# Patient Record
Sex: Male | Born: 1950 | Race: White | Hispanic: No | Marital: Married | Smoking: Never smoker
Health system: Southern US, Community
[De-identification: ages and names within clinical notes are randomized; demographics above are authoritative.]

## PROBLEM LIST (undated history)

## (undated) DIAGNOSIS — I2510 Atherosclerotic heart disease of native coronary artery without angina pectoris: Secondary | ICD-10-CM

## (undated) DIAGNOSIS — F32A Depression, unspecified: Secondary | ICD-10-CM

## (undated) DIAGNOSIS — F909 Attention-deficit hyperactivity disorder, unspecified type: Secondary | ICD-10-CM

## (undated) DIAGNOSIS — E785 Hyperlipidemia, unspecified: Secondary | ICD-10-CM

## (undated) DIAGNOSIS — K269 Duodenal ulcer, unspecified as acute or chronic, without hemorrhage or perforation: Secondary | ICD-10-CM

## (undated) DIAGNOSIS — Z972 Presence of dental prosthetic device (complete) (partial): Secondary | ICD-10-CM

## (undated) DIAGNOSIS — I10 Essential (primary) hypertension: Secondary | ICD-10-CM

## (undated) DIAGNOSIS — F329 Major depressive disorder, single episode, unspecified: Secondary | ICD-10-CM

## (undated) DIAGNOSIS — K259 Gastric ulcer, unspecified as acute or chronic, without hemorrhage or perforation: Secondary | ICD-10-CM

## (undated) DIAGNOSIS — H9190 Unspecified hearing loss, unspecified ear: Secondary | ICD-10-CM

## (undated) HISTORY — DX: Atherosclerotic heart disease of native coronary artery without angina pectoris: I25.10

## (undated) HISTORY — DX: Duodenal ulcer, unspecified as acute or chronic, without hemorrhage or perforation: K26.9

## (undated) HISTORY — DX: Hyperlipidemia, unspecified: E78.5

## (undated) HISTORY — PX: CORONARY ANGIOPLASTY: SHX604

## (undated) HISTORY — PX: INTESTINAL BYPASS: SHX1099

---

## 2005-09-02 ENCOUNTER — Emergency Department: Payer: Self-pay | Admitting: Emergency Medicine

## 2007-05-13 ENCOUNTER — Inpatient Hospital Stay (HOSPITAL_COMMUNITY): Admission: RE | Admit: 2007-05-13 | Discharge: 2007-05-15 | Payer: Self-pay | Admitting: Cardiovascular Disease

## 2007-07-19 ENCOUNTER — Ambulatory Visit: Payer: Self-pay | Admitting: Cardiovascular Disease

## 2007-07-19 ENCOUNTER — Emergency Department: Payer: Self-pay | Admitting: Internal Medicine

## 2008-01-15 ENCOUNTER — Encounter: Payer: Self-pay | Admitting: Cardiovascular Disease

## 2008-01-15 ENCOUNTER — Ambulatory Visit (HOSPITAL_COMMUNITY): Admission: RE | Admit: 2008-01-15 | Discharge: 2008-01-15 | Payer: Self-pay | Admitting: Cardiovascular Disease

## 2008-01-30 ENCOUNTER — Encounter: Payer: Self-pay | Admitting: Cardiovascular Disease

## 2008-07-02 ENCOUNTER — Encounter: Payer: Self-pay | Admitting: Cardiovascular Disease

## 2008-09-08 ENCOUNTER — Ambulatory Visit: Payer: Self-pay | Admitting: Specialist

## 2009-05-05 ENCOUNTER — Encounter: Payer: Self-pay | Admitting: Cardiovascular Disease

## 2010-04-19 ENCOUNTER — Emergency Department: Payer: Self-pay | Admitting: Emergency Medicine

## 2010-04-19 ENCOUNTER — Encounter: Payer: Self-pay | Admitting: Cardiovascular Disease

## 2010-04-20 ENCOUNTER — Telehealth: Payer: Self-pay | Admitting: Cardiovascular Disease

## 2010-04-21 ENCOUNTER — Encounter: Payer: Self-pay | Admitting: Cardiovascular Disease

## 2010-04-22 ENCOUNTER — Ambulatory Visit: Payer: Self-pay | Admitting: Cardiovascular Disease

## 2010-04-26 ENCOUNTER — Telehealth: Payer: Self-pay | Admitting: Cardiovascular Disease

## 2010-06-09 ENCOUNTER — Encounter: Payer: Self-pay | Admitting: Cardiovascular Disease

## 2010-09-13 NOTE — Cardiovascular Report (Signed)
Summary: Cardiac Cath Other  Cardiac Cath Other   Imported By: Harlon Flor 05/02/2010 14:42:54  _____________________________________________________________________  External Attachment:    Type:   Image     Comment:   External Document

## 2010-09-13 NOTE — Miscellaneous (Signed)
  Clinical Lists Changes  Medications: Added new medication of CRESTOR 20 MG TABS (ROSUVASTATIN CALCIUM) one tablet at bedtime - Signed Rx of CRESTOR 20 MG TABS (ROSUVASTATIN CALCIUM) one tablet at bedtime;  #30 x 0;  Signed;  Entered by: Bishop Dublin, CMA;  Authorized by: Dossie Arbour MD;  Method used: Electronically to [facility] [address], [place], [place], [place]  [address], Ph: [phone], Fax: [phone]    Prescriptions: CRESTOR 20 MG TABS (ROSUVASTATIN CALCIUM) one tablet at bedtime  #30 x 0   Entered by:   Bishop Dublin, CMA   Authorized by:   Dossie Arbour MD   Signed by:   Bishop Dublin, CMA on 06/09/2010   Method used:   Electronically to        [facility] (retail)       [address]       [place], [place]  [address]       Ph: [phone]       Fax: [phone]   RxID:   [id number]

## 2010-09-13 NOTE — Cardiovascular Report (Signed)
Summary: [facility] Cath Report   [facility] Cath Report   Imported By: Roderic Ovens 05/24/2010 09:12:44  _____________________________________________________________________  External Attachment:    Type:   Image     Comment:   External Document

## 2010-09-13 NOTE — Letter (Signed)
Summary: Cardiac Catheterization Instructions- Main Lab  [facility] [address]   [place], [place] [address]   Phone: [phone]  Fax: [phone]     04/21/2010 MRN: [id number]  Joshua Horn [address] [place], [place]  [address]  Dear Joshua Horn,   You are scheduled for Cardiac Catheterization on September 9 with Dr. Mariah Milling         .  Please arrive at the [facility] of [facility]  at 7:45 a.m. on the day of your procedure.  1. DIET     __x__ Nothing to eat or drink after midnight except your medications with a sip of water.  2. Come to the [place] office on September 8 for instructions.   3. MAKE SURE YOU TAKE YOUR ASPIRIN.  4.__x__ YOU MAY TAKE ALL of your remaining medications with a small amount of water.   5. Plan for one night stay - bring personal belongings (i.e. toothpaste, toothbrush, etc.)  6. Bring a current list of your medications and current insurance cards.  7. Must have a responsible person to drive you home.   8. Someone must be with you for the first 24 hours after you arrive home.  9. Please wear clothes that are easy to get on and off and wear slip-on shoes.  *Special note: Every effort is made to have your procedure done on time.  Occasionally there are emergencies that present themselves at the hospital that may cause delays.  Please be patient if a delay does occur.  If you have any questions after you get home, please call the office at the number listed above.  Benedict Needy, RN

## 2010-09-13 NOTE — Letter (Signed)
Summary: [facility] - Office Note  [facility] - Office Note   Imported By: Marylou Mccoy 06/07/2010 10:12:01  _____________________________________________________________________  External Attachment:    Type:   Image     Comment:   External Document

## 2010-09-13 NOTE — Letter (Signed)
Summary: Medical Record Release  Medical Record Release   Imported By: Harlon Flor 05/02/2010 14:43:30  _____________________________________________________________________  External Attachment:    Type:   Image     Comment:   External Document

## 2010-09-13 NOTE — Progress Notes (Signed)
Summary: MEDICATION QUESTIONS  Phone Note Call from Patient Call back at Home Phone [phone]   Caller: SELF Call For: [facility] Summary of Call: WOULD LIKE A CALL FROM ASHLEY REGARDING THE MEDICATION THAT SHE GAVE HIM YESTERDAY Initial call taken by: Harlon Flor,  April 26, 2010 9:05 AM  Follow-up for Phone Call        pt started taking ranexa last night and has no chest pain would like for [place] to send prescrption to [facility].  Follow-up by: Benedict Needy, RN,  April 26, 2010 9:30 AM    New/Updated Medications: RANEXA 500 MG XR12H-TAB (RANOLAZINE) Take 1 tablet by mouth two times a day Prescriptions: RANEXA 500 MG XR12H-TAB (RANOLAZINE) Take 1 tablet by mouth two times a day  #60 x 4   Entered by:   Benedict Needy, RN   Authorized by:   Dossie Arbour MD   Signed by:   Benedict Needy, RN on 04/26/2010   Method used:   Electronically to        [facility] (retail)       [address]       [place], [place]  [address]       Ph: [phone]       Fax: [phone]   RxID:   [id number]

## 2010-09-13 NOTE — Progress Notes (Signed)
Summary: CATH  Phone Note Call from Patient Call back at Home Phone [phone]   Caller: Denton Lank [facility]) Call For: [facility] Summary of Call: PT WOULD LIKE TO SCHEDULE CATH FOR FRIDAY Initial call taken by: Harlon Flor,  April 20, 2010 9:42 AM  Follow-up for Phone Call        notified patient cardiac cath is scheduled for April 22, 2010 at [facility]. Patient aware of cath. Follow-up by: Bishop Dublin, CMA,  April 20, 2010 12:31 PM

## 2010-09-13 NOTE — Letter (Signed)
Summary: [facility]  [facility]   Imported By: Marylou Mccoy 06/07/2010 10:37:19  _____________________________________________________________________  External Attachment:    Type:   Image     Comment:   External Document

## 2010-09-13 NOTE — Cardiovascular Report (Signed)
Summary: Pre Cath Orders   Pre Cath Orders   Imported By: Roderic Ovens 06/15/2010 11:53:01  _____________________________________________________________________  External Attachment:    Type:   Image     Comment:   External Document

## 2010-09-13 NOTE — Cardiovascular Report (Signed)
Summary: [facility]   Imported By: Roderic Ovens 06/24/2010 10:51:31  _____________________________________________________________________  External Attachment:    Type:   Image     Comment:   External Document

## 2010-11-03 ENCOUNTER — Telehealth: Payer: Self-pay

## 2010-11-03 MED ORDER — RANOLAZINE ER 500 MG PO TB12: 500.0000 mg | ORAL_TABLET | Freq: Two times a day (BID) | ORAL | Status: DC

## 2010-11-03 MED ORDER — ROSUVASTATIN CALCIUM 20 MG PO TABS: 20.0000 mg | ORAL_TABLET | Freq: Every day | ORAL | Status: DC

## 2010-11-03 NOTE — Telephone Encounter (Signed)
Needs a refill for ranexa 500 mg one tablet twice a day and crestor 20 mg one tablet at bedtime to [facility].

## 2010-12-27 NOTE — Cardiovascular Report (Signed)
Joshua, Horn                 ACCOUNT NO.:  [id number]   MEDICAL RECORD NO.:  [id number]          PATIENT TYPE:  OIB   LOCATION:  6524                         FACILITY:  [facility]   PHYSICIAN:  Antonieta Iba, MD   DATE OF BIRTH:  20-Feb-1951   DATE OF PROCEDURE:  DATE OF DISCHARGE:                            CARDIAC CATHETERIZATION   PROCEDURE PERFORMED:  1. Coronary angiogram.  2. Left heart catheterization and aortic root angiogram.   PHYSICIANS:  Julien Nordmann, M.D.  Nicki Guadalajara, M.D.   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  He had a stress test that showed  inferior wall hyperprofusion that was reversible.   PROCEDURE IN DETAIL:  After detailed description of the procedure,  including the risks, benefits, and potential alternatives, informed  consent was obtained.  The patient was premedicated with valium 5 mg and  Benadryl 25 mg and was brought to the cardiac catheterization lab.  The  patient was prepped and draped in the usual sterile fashion.   Local anesthesia was obtained using 1% lidocaine.  The right femoral  artery was cannulated using a modified Seldinger technique and a 6-  [place] Intraducer sheath was inserted.   Selective coronary angiography was then performed using a 6-French  Judkins left #4 catheter to engage the left main coronary artery and a 6-  French Judkins right #4 catheter to engage the right coronary artery.  Arteriography was performed using hand injections of contrast in  multiple projections.  Left ventriculography and aortic root shot was  performed by power injection through a 6-French pigtail  catheter.  Following the procedure, the arterial sheath was closed by holding  pressure and maintaining hemostasis and there were no complications.   FINDINGS:  1. Left main coronary artery:  The left main is a moderate size vessel      that trifurcates into the left anterior descending artery, the left      circumflex artery and the ramus vessel.  The artery has no      significant disease.  2. The left anterior descending:  The left anterior descending is a      moderate sized vessel that gives rise to one large bifurcating      diagonal vessel.  There is no significant disease in the diagonal,      there is 30% mid left anterior descending disease in small      sequential regions.  3. Ramus:  The ramus is a moderate size vessel with no significant      disease.  4. Left circumflex:  The left circumflex is a dominant moderate sized      vessel that gives rise to several moderate sized marginal arteries      as  well as posterior descending artery vessel.  The OM1 has 80% to      90% proximal focal concentric stenosis with TIMI-2 to TIMI-3 flow.      The distal left circumflex is notable for severe disease of the      posterior descending artery which is 90% and diffuse throughout the      small branch.  5. Right coronary artery:  The right coronary artery is a nondominant      vessel that is small and occluded at the ostium.  There are      collaterals from the left to the right from the distal obtuse      marginal, distal circumflex to the right coronary artery.  6. Left ventricle:  There is normal systolic function.  No mitral      regurgitation was appreciated.  No aortic stenosis on pullback.   CONCLUSION:  Severe two-vessel disease with occlusion of the nondominant  right coronary artery as well as 80% to 90% stenosis of the obtuse  marginal #1 branch.  There is severe circumflex disease of the posterior  descending artery.  His severe stenosis of the obtuse marginal will be   intervened upon today.  His severe posterior descending artery disease  will be managed medically.  His positive inferior region on stress test  is most likely secondary to his occluded right coronary artery and small  collaterals providing left to right flow.      Antonieta Iba, MD  Electronically Signed     TJG/MEDQ  D:  05/13/2007  T:  05/14/2007  Job:  [id number]

## 2010-12-27 NOTE — Discharge Summary (Signed)
Joshua Horn, Joshua Horn                 ACCOUNT NO.:  [id number]   MEDICAL RECORD NO.:  [id number]          PATIENT TYPE:  OIB   LOCATION:  6524                         FACILITY:  [facility]   PHYSICIAN:  Abelino Derrick, P.A.   DATE OF BIRTH:  [birth year]   DATE OF ADMISSION:  05/13/2007  DATE OF DISCHARGE:  05/15/2007                               DISCHARGE SUMMARY   DISCHARGE DIAGNOSES:  1. Unstable angina, OM PCI and stenting this admission.  2. Slight CK-MB and troponin bump postprocedure.  3. Treated dyslipidemia.  4. Treated hypertension.  5. Family history of coronary disease.   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  He  had a 70% RCA and total PDA in 2000.  He presented to Dr. Mariah Milling in  [place] with dyspnea on exertion and exertional pressure for a couple  weeks prior to his office visit.  He had a stress test that showed  inferior ischemia.  The patient was set up for diagnostic  catheterization.  This was done May 13, 2007.  This revealed a 30%  LAD, 80-90% OM1, and occluded nondominant RCA with collaterals.  LV  function was normal.  The patient underwent OM1 intervention by Dr.  Tresa Endo.  It was a somewhat difficult procedure.  He ultimately did get a  PROMUS stent placed.  The patient did have a bump in his CK and troponin  postprocedure with a peak CK 144 with 11 MB and troponin 1.  He is kept  another 24 hours for observation.  The patient had no further chest  pain, no arrhythmia.  We feel he can be discharged May 15, 2007.   DISCHARGE MEDICATIONS:  1. Lipitor 40 mg a day.  2. Diovan 80 mg a day.  3. Effexor XR 150 daily.  4. Concerta 54 mg a day.  5. Zetia 10 mg a day.  6. Isosorbide mononitrate 30 mg a day.  7. Coated aspirin daily.  8. Plavix 75 mg a day.  9. Metoprolol 25 mg a day.  10.Nitroglycerin sublingual p.r.n.   LABORATORY DATA:  Sodium 136,  potassium 3.9, BUN 15, creatinine 0.67.  White count 7.3, hemoglobin 11.7, hematocrit 33, platelets 240.  CK  peaked as noted above.   EKG shows sinus rhythm with no acute changes.   DISPOSITION:  The patient is discharged in stable condition and will  follow up with Dr. Mariah Milling in [place] in a couple weeks.      Abelino Derrick, P.ALenard Lance  D:  05/15/2007  T:  05/15/2007  Job:  [id number]

## 2010-12-27 NOTE — Cardiovascular Report (Signed)
Joshua Horn, Joshua Horn                 ACCOUNT NO.:  [id number]   MEDICAL RECORD NO.:  [id number]          PATIENT TYPE:  OIB   LOCATION:  2852                         FACILITY:  [facility]   PHYSICIAN:  Antonieta Iba, MD   DATE OF BIRTH:  [birth year]   DATE OF PROCEDURE:  01/15/2008  DATE OF DISCHARGE:                            CARDIAC CATHETERIZATION   PROCEDURE PERFORMED:  1. Coronary angiogram.  2. Left heart catheterization.  3. Left ventriculogram.   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  He had a 2.5 x 18 mm Promus stent placed in the  OM with good results.  He has been symptom-free for many months until  recently.  Over the past several weeks to months, he has developed  worsening night sweats, profound fatigue.  These symptoms are similar to  what he had prior to his obtuse marginal stenting.  Given his symptoms  and known coronary disease, he was sent for cardiac catheterization for  further evaluation.   PROCEDURE IN DETAIL:  After detailed description of the procedure,  including the risks, benefits, and potential alternatives, informed  consent was obtained.  The patient was premedicated with Valium 5 mg and  Benadryl 25 mg and was brought to the cardiac catheterization lab.  The  patient was prepped and draped in the usual sterile fashion.   Local anesthesia was obtained using 1% lidocaine.  The right femoral  artery was cannulated using a modified Seldinger technique and a 5-  French introducer sheath was inserted.   Selective coronary angiography was then performed using a 5-French  Judkins left #4 catheter to engage the left main coronary artery and a 5-  French Judkins  right #4 catheter to engage the right coronary artery.  Angiography was performed using hand injections of contrast in multiple  projections.  Left ventriculography was performed using a 5-French  pigtail catheter.  Following the procedure, the arterial sheath was  removed and hemostasis was obtained.  No complications were noted.   FINDINGS:  1. Left main coronary artery; the left main is a moderate-sized vessel      that trifurcates into the left anterior descending, the left      circumflex and the ramus vessel.  The artery has no significant      disease.  2. Left anterior descending; the LAD is a moderate-sized vessel that      gives one large bifurcating diagonal vessel.  There is mild mid LAD      disease in the region of myocardial bridging.  There is      approximately 30%-40% diffuse disease.  There is also a region of      40%-50% mid diagonal disease after the first diagonal branch      takeoff.  Otherwise, there is no significant disease noted.  3. Ramus; the ramus is a moderate-sized vessel that has a 30%-40%      proximal diffuse disease.  Otherwise, no significant disease.  4. Left circumflex; the left circumflex is a dominant or codominant      moderate-sized vessel that gives rise to several obtuse marginal      branches.  The OM1 has had previous stent proximally and this      appears to be stable with mild in-stent restenosis.  There is 30%      ostial disease.  Otherwise, there is no significant disease of the      OM1.  The OM2 and 3 are small to moderate-sized vessels.  The      distal OM/PDA is severely diseased and subtotally occluded and is a      small branch.  Otherwise, no significant disease in the OM2 and 3      and circumflex.  5. Right coronary artery; the RCA is a nondominant or codominant      vessel that is small and occluded at the ostial/proximal region.      There are small collaterals from left-to-right extending to the      distal OM.   Left  ventricles/ventriculography; there is normal systolic function.  No  mitral regurgitation appreciated.  No aortic stenosis on pullback.   CONCLUSION:  Mild-to-moderate ramus and LAD disease as noted above.  Patent stent in the proximal OM1.  A 100% proximally occluded RCA.  Severe distal PDA disease of the circumflex.  No significant change  since the prior catheterization in 2008.  Medical management has been  recommended.  These images were reviewed with Dr. Yates Decamp who is in  agreement.  He will followup in clinic with me in 1-week time.      Antonieta Iba, MD  Electronically Signed     TJG/MEDQ  D:  01/15/2008  T:  01/16/2008  Job:  [id number]

## 2010-12-27 NOTE — Cardiovascular Report (Signed)
NAMESHEILA, GERVASI NO.:  [id number]   MEDICAL RECORD NO.:  [id number]          PATIENT TYPE:  OIB   LOCATION:  6524                         FACILITY:  [facility]   PHYSICIAN:  Nicki Guadalajara, M.D.     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  The patient recently  has experienced chest discomfort and a nuclear stress test demonstrated  moderate ischemia in the mid to distal inferior region and apical  region.  He had undergone diagnostic cardiac catheterization today by  Dr. Julien Nordmann and was found to have total occlusion of his RCA with  left-to-right collaterals.  He had 30% narrowings in his mid LAD, there  was 95-99% stenosis in a moderate-sized upward-takeoff obtuse marginal  vessel, and the distal circumflex PDA-like branch was diffusely narrowed  and small-caliber.  The patient is now referred for percutaneous  coronary intervention to his obtuse marginal vessel of his circumflex  system.   PROCEDURE:  The patient's 5-French arterial sheath was in place from his  diagnostic catheterization by Dr. Mariah Milling.  This sheath was upgraded to a  6-French system.  Since the patient had been not been on antiplatelet  therapy, he was given 600 mg oral Plavix, aspirin, and also Integrilin  double bolus and drip was infused, and weight-adjusted heparinization.  ACT was documented to be therapeutic.  A 6-French Voda guide #4 curved  was used for the procedure.  There was significant difficulty in  cannulating the marginal vessel due to the greater than 120-degree to  probably 150-degree angle arising superiorly off the AV groove  circumflex.  Initially a Luge wire, Asahi soft, Asahi  medium wires were  used.  A 2.5-mm balloon dilatation catheter was also used for support  but still unsuccessful.  Ultimately this was able to be cannulated  successfully by using a Choice PT Graphic moderate-support wire.  With  much difficulty, the lesion was able to be crossed.  Initially, a 2.5 x  15-mm Maverick balloon was then advanced to the proximal portion of this  marginal vessel.  Numerous dilatations were made but there continued to  be a significant dog boneat the site of high-grade stenosis.  This  never yielded with the Maverick balloon despite pressures up to 10  atmospheres.  This was then changed to a PowerSail balloon.  This still  was not successful.  Ultimately the balloon was changed to a DURAglide  and ultimately this was successful in popping the highly stenotic  lesion and dilatation was made up to 10 atmospheres x2.  Because of the  vessel tortuosity and angle, it was felt that a Promus stent would be  optimal.  A 2.5 x 18-mm Promus stent was then able to be successfully  inserted with careful juxtaposition of the proximal portion of the stent  at the ostium  and extending distally.  Two dilatations were made up to  2.81 mm.  A 2.75 Quantum balloon was then attempted to be inserted but  at this point the guiding catheter had become very soft and ultimately  pulled back out of the left main and the wire engagement in the OM  vessel was lost.  Multiple attempts were made in an attempt to re-pass  the wire but due to the high level of difficulty initially with the  complexity of the upward-takeoff lesion and the potential risk and, if  successful, the wire ultimately getting under a strut, since the  angiographic result appeared excellent, the decision was made,  therefore, not to continue attempts to ultimately post dilate.  The  patient tolerated the procedure well.  During the procedure, he did  receive several doses of Versed and fentanyl.  He did experience  chest  pressure with balloon inflation, which improved following balloon  deflation and removal.  Scout angiography confirmed an excellent  angiographic result.  The arterial sheath was sutured in place with  plans for sheath removal later today.   ANGIOGRAPHIC DATA:  Please refer to Dr. Windell Hummingbird diagnostic  catheterization report.  At the start of the percutaneous coronary  intervention, the left circumflex was a large-caliber vessel arising  from the left main.  The first marginal vessel arose superiorly in  almost a 150-degree angle arising from the proximal circumflex and  beyond.  The ostial and proximal segment was at least greater than 95%  stenosis.  The vessel in its mid segment was moderately tortuous.  There  was diffuse distal disease in the PDA and PLA vessels as outlined by Dr.  Mariah Milling.  Following difficult but ultimately successful percutaneous  coronary intervention to the circumflex marginal vessel with PTCA and  ultimate stenting with a 2.75 x 18-mm Promus stent dilated up to 2.81  mm, the 95% stenosis was reduced to 0%.  There was brisk TIMI-3 flow.  There was no evidence for dissection.   IMPRESSION:  Difficult but successful percutaneous transluminal coronary  angioplasty/stenting of a 95% obtuse marginal #1 stenosis of the left  circumflex coronary artery with ultimate insertion of a 2.75 x 18-mm  drug-eluting Promus stent, dilated to 2.81 mm, done with double bolus  Integrilin, heparin, 600 mg of oral Plavix, and aspirin.           ______________________________  Nicki Guadalajara, M.D.     TK/MEDQ  D:  05/13/2007  T:  05/14/2007  Job:  [id number]   cc:   Antonieta Iba, MD  Dewaine Oats

## 2011-01-27 ENCOUNTER — Other Ambulatory Visit: Payer: Self-pay | Admitting: Emergency Medicine

## 2011-01-27 MED ORDER — RANOLAZINE ER 500 MG PO TB12: 500.0000 mg | ORAL_TABLET | Freq: Two times a day (BID) | ORAL | Status: DC

## 2011-05-25 LAB — CARDIAC PANEL(CRET KIN+CKTOT+MB+TROPI)
CK, MB: 7.2 — ABNORMAL HIGH
Total CK: 114
Total CK: 136
Total CK: 144
Troponin I: 0.43 — ABNORMAL HIGH
Troponin I: 0.35 — ABNORMAL HIGH
Troponin I: 1

## 2011-05-25 LAB — BASIC METABOLIC PANEL
BUN: 15
CO2: 28
Chloride: 105
Creatinine, Ser: 0.67
Potassium: 3.9

## 2011-05-25 LAB — CBC
MCHC: 34.7
Platelets: 240
RDW: 13.1

## 2011-08-11 ENCOUNTER — Other Ambulatory Visit: Payer: Self-pay

## 2011-08-11 MED ORDER — ROSUVASTATIN CALCIUM 20 MG PO TABS: 20.0000 mg | ORAL_TABLET | Freq: Every day | ORAL | Status: DC

## 2011-11-13 ENCOUNTER — Telehealth: Payer: Self-pay

## 2011-11-13 MED ORDER — ROSUVASTATIN CALCIUM 20 MG PO TABS: 20.0000 mg | ORAL_TABLET | Freq: Every day | ORAL | Status: DC

## 2011-11-13 NOTE — Telephone Encounter (Signed)
Refill sent for crestor 20 mg to [facility] in [place]. Patient is to be called for a follow up appointment per Dr. Mariah Milling.

## 2012-01-01 ENCOUNTER — Other Ambulatory Visit: Payer: Self-pay | Admitting: *Deleted

## 2012-01-01 MED ORDER — ROSUVASTATIN CALCIUM 20 MG PO TABS: 20.0000 mg | ORAL_TABLET | Freq: Every day | ORAL | Status: DC

## 2012-01-01 NOTE — Telephone Encounter (Signed)
Called patient and LMOM to return call to set up appoinment with Dr. Mariah Milling and Refilled Crestor 20 mg with no refills.

## 2012-02-08 ENCOUNTER — Other Ambulatory Visit: Payer: Self-pay

## 2012-02-08 MED ORDER — RANOLAZINE ER 500 MG PO TB12: 500.0000 mg | ORAL_TABLET | Freq: Two times a day (BID) | ORAL | Status: DC

## 2012-02-08 NOTE — Telephone Encounter (Signed)
Refill sent for Ranexa and told pharmacy to have patient call the office to get an appointment with Dr. Mariah Milling.

## 2012-02-09 ENCOUNTER — Ambulatory Visit (INDEPENDENT_AMBULATORY_CARE_PROVIDER_SITE_OTHER): Payer: BC Managed Care – PPO | Admitting: Cardiovascular Disease

## 2012-02-09 ENCOUNTER — Encounter: Payer: Self-pay | Admitting: Cardiovascular Disease

## 2012-02-09 VITALS — BP 148/90 | HR 81 | Ht 72.0 in | Wt 184.0 lb

## 2012-02-09 DIAGNOSIS — I2510 Atherosclerotic heart disease of native coronary artery without angina pectoris: Secondary | ICD-10-CM | POA: Insufficient documentation

## 2012-02-09 DIAGNOSIS — I10 Essential (primary) hypertension: Secondary | ICD-10-CM

## 2012-02-09 DIAGNOSIS — E785 Hyperlipidemia, unspecified: Secondary | ICD-10-CM

## 2012-02-09 MED ORDER — EZETIMIBE 10 MG PO TABS: 10.0000 mg | ORAL_TABLET | Freq: Every day | ORAL | Status: DC

## 2012-02-09 MED ORDER — ROSUVASTATIN CALCIUM 20 MG PO TABS: 20.0000 mg | ORAL_TABLET | Freq: Every day | ORAL | Status: DC

## 2012-02-09 MED ORDER — METOPROLOL SUCCINATE ER 25 MG PO TB24: 25.0000 mg | ORAL_TABLET | Freq: Every day | ORAL | Status: DC

## 2012-02-09 MED ORDER — CLOPIDOGREL BISULFATE 75 MG PO TABS: 75.0000 mg | ORAL_TABLET | Freq: Every day | ORAL | Status: DC

## 2012-02-09 NOTE — Assessment & Plan Note (Signed)
Currently with no symptoms of angina. No further workup at this time. Continue current medication regimen. We have suggested he could wean the ranexa if not needed for chest pain. He is currently asymptomatic.

## 2012-02-09 NOTE — Assessment & Plan Note (Signed)
We have suggested he stay on his current medication regimen. Goal LDL less than 70.

## 2012-02-09 NOTE — Assessment & Plan Note (Signed)
Blood pressure is well controlled on today's visit. No changes made to the medications. 

## 2012-02-09 NOTE — Progress Notes (Signed)
Patient ID: Joshua Horn, male    DOB: 1951/02/19, 61 y.o.   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  He had a 2.5 x 18 mm Promus stent placed in the OM with good results.  He had chest pain symptoms in 2009 with repeat catheterization showing no significant progression of his disease .he presents to establish care in our office.  He reports that he is doing well with no symptoms. He is tolerating his medications well with no complaints. 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 His life is less stressful. Overall he has no complaints. He reports that his cholesterol is within a good range.  EKG shows normal sinus rhythm with rate 81 beats per minute with no significant ST or T wave changes      Outpatient Encounter Prescriptions as of 02/09/2012  Medication Sig Dispense Refill  . clopidogrel (PLAVIX) 75 MG tablet Take 1 tablet (75 mg total) by mouth daily.  30 tablet  11  . ezetimibe (ZETIA) 10 MG tablet Take 1 tablet (10 mg total) by mouth daily.  30 tablet  11  . FLUoxetine (PROZAC) 20 MG tablet Take 20 mg by mouth 2 (two) times daily.      . methylphenidate (CONCERTA) 54 MG CR tablet Take 54 mg by mouth every morning.      . metoprolol succinate (TOPROL-XL) 25 MG 24 hr tablet Take 1 tablet (25 mg total) by mouth daily.  30 tablet  11  . ranolazine (RANEXA) 500 MG 12 hr tablet Take 1 tablet (500 mg total) by mouth 2 (two) times daily.  60 tablet  0  . rosuvastatin (CRESTOR) 20 MG tablet Take 1 tablet (20 mg total) by mouth at bedtime.  30 tablet  11    Review of Systems  Constitutional: Negative.   HENT: Negative.   Eyes: Negative.   Respiratory: Negative.     Cardiovascular: Negative.   Gastrointestinal: Negative.   Musculoskeletal: Negative.   Skin: Negative.   Neurological: Negative.   Hematological: Negative.   Psychiatric/Behavioral: Negative.   All other systems reviewed and are negative.   BP 148/90  Pulse 81  Ht 6' (1.829 m)  Wt 184 lb (83.462 kg)  BMI 24.95 kg/m2  Physical Exam  Nursing note and vitals reviewed. Constitutional: He is oriented to person, place, and time. He appears well-developed and well-nourished.  HENT:  Head: Normocephalic.  Nose: Nose normal.  Mouth/Throat: Oropharynx is clear and moist.  Eyes: Conjunctivae are normal. Pupils are equal, round, and reactive to light.  Neck: Normal range of motion. Neck supple. No JVD present.  Cardiovascular: Normal rate, regular rhythm, S1 normal, S2 normal, normal heart sounds and intact distal pulses.  Exam reveals no gallop and no friction rub.   No murmur heard. Pulmonary/Chest: Effort normal and breath sounds normal. No respiratory distress. He has no wheezes. He has no rales. He exhibits no tenderness.  Abdominal: Soft. Bowel sounds are normal. He exhibits no distension. There is no tenderness.  Musculoskeletal: Normal range of motion. He exhibits no edema and no tenderness.  Lymphadenopathy:    He has no cervical adenopathy.  Neurological: He is  alert and oriented to person, place, and time. Coordination normal.  Skin: Skin is warm and dry. No rash noted. No erythema.  Psychiatric: He has a normal mood and affect. His behavior is normal. Judgment and thought content normal.           Assessment and Plan

## 2012-02-09 NOTE — Patient Instructions (Addendum)
You are doing well. No medication changes were made.  Please call us if you have new issues that need to be addressed before your next appt.  Your physician wants you to follow-up in: 12 months.  You will receive a reminder letter in the mail two months in advance. If you don't receive a letter, please call our office to schedule the follow-up appointment. 

## 2012-10-25 ENCOUNTER — Other Ambulatory Visit: Payer: Self-pay | Admitting: *Deleted

## 2012-10-25 MED ORDER — RANOLAZINE ER 500 MG PO TB12: 500.0000 mg | ORAL_TABLET | Freq: Two times a day (BID) | ORAL | Status: DC

## 2012-10-25 NOTE — Telephone Encounter (Signed)
Refilled Ranexa 500 mg sent to [facility].

## 2012-11-22 ENCOUNTER — Encounter: Payer: Self-pay | Admitting: Cardiovascular Disease

## 2012-11-22 ENCOUNTER — Ambulatory Visit: Payer: Self-pay | Admitting: Cardiovascular Disease

## 2012-11-22 ENCOUNTER — Ambulatory Visit (INDEPENDENT_AMBULATORY_CARE_PROVIDER_SITE_OTHER): Payer: BC Managed Care – PPO | Admitting: Cardiovascular Disease

## 2012-11-22 ENCOUNTER — Other Ambulatory Visit: Payer: Self-pay

## 2012-11-22 VITALS — BP 124/78 | HR 70 | Ht 72.0 in | Wt 190.5 lb

## 2012-11-22 DIAGNOSIS — R079 Chest pain, unspecified: Secondary | ICD-10-CM

## 2012-11-22 DIAGNOSIS — E785 Hyperlipidemia, unspecified: Secondary | ICD-10-CM

## 2012-11-22 DIAGNOSIS — I10 Essential (primary) hypertension: Secondary | ICD-10-CM

## 2012-11-22 DIAGNOSIS — I2510 Atherosclerotic heart disease of native coronary artery without angina pectoris: Secondary | ICD-10-CM

## 2012-11-22 DIAGNOSIS — R0602 Shortness of breath: Secondary | ICD-10-CM

## 2012-11-22 MED ORDER — NITROGLYCERIN 0.4 MG SL SUBL: 0.4000 mg | SUBLINGUAL_TABLET | SUBLINGUAL | Status: DC | PRN

## 2012-11-22 NOTE — Patient Instructions (Addendum)
You are doing well. No medication changes were made.  We will schedule you for a cardiac cath on Friday April 18th   Please call us if you have new issues that need to be addressed before your next appt.  Your physician wants you to follow-up in: 6 months.  You will receive a reminder letter in the mail two months in advance. If you don't receive a letter, please call our office to schedule the follow-up appointment.   [facility] Cardiac Cath Instructions   You are scheduled for a Cardiac Cath on:_4/18/14________________________  Please arrive at __7:00_____am on the day of your procedure  You will need to pre-register prior to the day of your procedure.  Enter through the [employer] at [facility].  Registration is the first desk on your right.  Please take the procedure order we have given you in order to be registered appropriately  Do not eat/drink anything after midnight  Someone will need to drive you home  It is recommended someone be with you for the first 24 hours after your procedure  Wear clothes that are easy to get on/off and wear slip on shoes if possible   Medications bring a current list of all medications with you  _x__ You may take all of your medications the morning of your procedure with enough water to swallow safely   Day of your procedure: Arrive at the [facility] entrance.  Free valet service is available.  After entering the [facility] you will pass on the right admitting/registration desk, proceed pass the Cardiopulmonary desk to the first open hallway on your right, overhead you will see a sign reading Cardiac Cath./Special Procedures Waiting Room.  After taking the hallway, the waiting room is on the left, once you enter the waiting room, there is a wall phone on the left, just inside the door.  Dial [phone] this will connect you to the Special Recovery Nurse's Station; let them know you have arrived.  The usual length of stay after your procedure is about 2 to  3 hours.  This can vary.  If you have any questions, please call our office at [phone], or you may call the cardiac cath lab at [facility] directly at [phone]

## 2012-11-22 NOTE — Assessment & Plan Note (Signed)
Known CAD, occluded RCA, stent to his left circumflex in the past. Now with worsening anginal symptoms.

## 2012-11-22 NOTE — Progress Notes (Signed)
Patient ID: Joshua Horn, male    DOB: 01-Jul-1951, 62 y.o.   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  He had a 2.5 x 18 mm Promus stent placed in the OM with good results.  He had chest pain symptoms in 2009 with repeat catheterization showing no significant progression of his disease .he presents for routine followup and for symptoms of chest pain.  He reports that over the past several weeks, he has had worsening symptoms of shortness of breath, chest pain and sweating. At least 3 severe episodes, one [place]. He had to stop to recover. His wife noticed. He does not exercise on a regular basis. Wife agrees that there has been a change in his ability to exert himself and he is sometimes cold, sweaty. He has felt the need for nitroglycerin for chest pain but did not have any. He was trying not to use it as it gives him a headache. Instead he has been limiting his activities. 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   EKG shows normal sinus rhythm with rate 81 beats per minute with nonspecific ST abnormality in lead V5, V6      Outpatient Encounter Prescriptions as of 11/22/2012  Medication Sig Dispense Refill  . clopidogrel (PLAVIX) 75 MG tablet Take 1 tablet (75 mg total) by mouth daily.  30 tablet  11  . ezetimibe (ZETIA) 10 MG tablet Take 1 tablet (10 mg total) by mouth daily.  30 tablet  11  . FLUoxetine (PROZAC) 20 MG tablet Take 20 mg by mouth 2 (two) times daily.      . methylphenidate (CONCERTA) 54 MG CR tablet Take 54 mg by mouth every morning.      . metoprolol succinate (TOPROL-XL) 25 MG 24 hr tablet Take 1 tablet (25 mg total) by mouth daily.  30 tablet  11  . ranolazine  (RANEXA) 500 MG 12 hr tablet Take 1 tablet (500 mg total) by mouth 2 (two) times daily.  60 tablet  3  . rosuvastatin (CRESTOR) 20 MG tablet Take 1 tablet (20 mg total) by mouth at bedtime.  30 tablet  11  . nitroGLYCERIN (NITROSTAT) 0.4 MG SL tablet Place 1 tablet (0.4 mg total) under the tongue every 5 (five) minutes as needed for chest pain.  25 tablet  6   No facility-administered encounter medications on file as of 11/22/2012.    Review of Systems  Constitutional: Negative.   HENT: Negative.   Eyes: Negative.   Respiratory: Positive for chest tightness.   Cardiovascular: Positive for chest pain.  Gastrointestinal: Negative.   Musculoskeletal: Negative.   Skin: Negative.   Neurological: Negative.   Psychiatric/Behavioral: Negative.   All other systems reviewed and are negative.   BP 124/78  Pulse 70  Ht 6' (1.829 m)  Wt 190 lb 8 oz (86.41 kg)  BMI 25.83 kg/m2  Physical Exam  Nursing note and vitals reviewed. Constitutional: He is oriented to person, place, and time. He appears well-developed and well-nourished.  HENT:  Head: Normocephalic.  Nose: Nose normal.  Mouth/Throat: Oropharynx is clear and moist.  Eyes: Conjunctivae are normal. Pupils are equal, round, and reactive to light.  Neck: Normal range of motion. Neck supple. No JVD present.  Cardiovascular: Normal rate, regular rhythm, S1 normal, S2 normal, normal heart sounds and intact distal pulses.  Exam reveals no gallop and no friction rub.   No murmur heard. Pulmonary/Chest: Effort normal and breath sounds normal. No respiratory distress. He has no wheezes. He has no rales. He exhibits no tenderness.  Abdominal: Soft. Bowel sounds are normal. He exhibits no distension. There is no tenderness.  Musculoskeletal: Normal range of motion. He exhibits no edema and no tenderness.  Lymphadenopathy:    He has no cervical adenopathy.  Neurological: He is alert and oriented to person, place, and time. Coordination normal.   Skin: Skin is warm and dry. No rash noted. No erythema.  Psychiatric: He has a normal mood and affect. His behavior is normal. Judgment and thought content normal.      Assessment and Plan

## 2012-11-22 NOTE — Assessment & Plan Note (Signed)
Blood pressure is well controlled on today's visit. No changes made to the medications. 

## 2012-11-22 NOTE — Assessment & Plan Note (Addendum)
Recent symptoms of chest tightness, sweating, shortness of breath. This is concerning for angina given his history of known coronary artery disease. Wife and patient are very concerned given the dramatic change in his symptoms, also in light of known occluded RCA, prior PCI to his left circumflex. We'll schedule him for a cardiac catheterization next week.  Risk and benefits were discussed with him. No medications to hold. He is agreeable to proceed with the catheterization. We will need to order routine chest x-ray and lab work today in preparation.

## 2012-11-22 NOTE — Assessment & Plan Note (Signed)
Cholesterol is at goal on the current lipid regimen. No changes to the medications were made.  

## 2012-11-23 LAB — CBC WITH DIFFERENTIAL
Eos: 2 % (ref 0–5)
Immature Grans (Abs): 0 10*3/uL (ref 0.0–0.1)
Immature Granulocytes: 0 % (ref 0–2)
Lymphocytes Absolute: 1.6 10*3/uL (ref 0.7–3.1)
MCV: 90 fL (ref 79–97)
Monocytes: 12 % (ref 4–12)
Platelets: 311 10*3/uL (ref 155–379)
RBC: 4.68 x10E6/uL (ref 4.14–5.80)
WBC: 7.6 10*3/uL (ref 3.4–10.8)

## 2012-11-23 LAB — BASIC METABOLIC PANEL
BUN: 14 mg/dL (ref 8–27)
Chloride: 103 mmol/L (ref 97–108)
Creatinine, Ser: 0.87 mg/dL (ref 0.76–1.27)
GFR calc Af Amer: 108 mL/min/{1.73_m2} (ref >59)
GFR calc non Af Amer: 93 mL/min/{1.73_m2} (ref >59)
Glucose: 101 mg/dL — ABNORMAL HIGH (ref 65–99)

## 2012-11-23 LAB — PROTIME-INR: Prothrombin Time: 11.1 s (ref 9.1–12.0)

## 2012-11-29 ENCOUNTER — Ambulatory Visit: Payer: Self-pay | Admitting: Cardiovascular Disease

## 2012-11-29 DIAGNOSIS — I2510 Atherosclerotic heart disease of native coronary artery without angina pectoris: Secondary | ICD-10-CM

## 2012-11-29 HISTORY — PX: CARDIAC CATHETERIZATION: SHX172

## 2013-01-10 ENCOUNTER — Encounter: Payer: Self-pay | Admitting: Cardiovascular Disease

## 2013-01-10 ENCOUNTER — Ambulatory Visit (INDEPENDENT_AMBULATORY_CARE_PROVIDER_SITE_OTHER): Payer: BC Managed Care – PPO | Admitting: Cardiovascular Disease

## 2013-01-10 VITALS — BP 110/82 | HR 66 | Ht 72.0 in | Wt 185.2 lb

## 2013-01-10 DIAGNOSIS — E785 Hyperlipidemia, unspecified: Secondary | ICD-10-CM

## 2013-01-10 DIAGNOSIS — I10 Essential (primary) hypertension: Secondary | ICD-10-CM

## 2013-01-10 DIAGNOSIS — I2510 Atherosclerotic heart disease of native coronary artery without angina pectoris: Secondary | ICD-10-CM

## 2013-01-10 NOTE — Assessment & Plan Note (Signed)
Blood pressure is well controlled on today's visit. No changes made to the medications. 

## 2013-01-10 NOTE — Patient Instructions (Addendum)
You are doing well. No medication changes were made.  Please call us if you have new issues that need to be addressed before your next appt.  Your physician wants you to follow-up in: 12 months.  You will receive a reminder letter in the mail two months in advance. If you don't receive a letter, please call our office to schedule the follow-up appointment. 

## 2013-01-10 NOTE — Progress Notes (Signed)
Patient ID: Joshua Horn, male    DOB: 16-Dec-1950, 62 y.o.   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  He had a 2.5 x 18 mm Promus stent placed in the OM with good results.  He had chest pain symptoms in 2009 with repeat catheterization showing no significant progression of his disease . He presents for routine followup after recent cardiac catheterization.  On his last clinic visit, he was having chest discomfort. Cardiac catheterization done after long discussion Study is dated 11/29/2012. The showed chronically occluded proximal RCA, moderate distal left circumflex disease, OM1 stent was patent with no in-stent restenosis, LAD with mild to moderate mid disease, long region, normal ejection fraction estimated at greater than 55%  Overall he feels well. Now notes that everything is stable. Is trying to bike more. No complications after the cardiac catheterization. No other complaints. Tolerating his medications well  EKG shows normal sinus rhythm with rate 66 beats per minute with no significant ST or T wave changes      Outpatient Encounter Prescriptions as of 01/10/2013  Medication Sig Dispense Refill  . clopidogrel (PLAVIX) 75 MG tablet Take 1 tablet (75 mg total) by mouth daily.  30 tablet  11  . ezetimibe (ZETIA) 10 MG tablet Take 1 tablet (10 mg total) by mouth daily.  30 tablet  11  . FLUoxetine (PROZAC) 20 MG tablet Take 20 mg by mouth 2 (two) times daily.      . methylphenidate (CONCERTA) 54 MG CR tablet Take 54 mg by mouth every morning.      . metoprolol succinate (TOPROL-XL) 25 MG 24 hr tablet Take 1 tablet (25 mg total) by mouth daily.  30 tablet  11  . nitroGLYCERIN (NITROSTAT) 0.4 MG SL tablet Place 1 tablet  (0.4 mg total) under the tongue every 5 (five) minutes as needed for chest pain.  25 tablet  6  . ranolazine (RANEXA) 500 MG 12 hr tablet Take 1 tablet (500 mg total) by mouth 2 (two) times daily.  60 tablet  3  . rosuvastatin (CRESTOR) 20 MG tablet Take 1 tablet (20 mg total) by mouth at bedtime.  30 tablet  11   No facility-administered encounter medications on file as of 01/10/2013.    Review of Systems  Constitutional: Negative.   HENT: Negative.   Eyes: Negative.   Gastrointestinal: Negative.   Musculoskeletal: Negative.   Skin: Negative.   Neurological: Negative.   Psychiatric/Behavioral: Negative.   All other systems reviewed and are negative.   BP 110/82  Pulse 66  Ht 6' (1.829 m)  Wt 185 lb 4 oz (84.029 kg)  BMI 25.12 kg/m2  Physical Exam  Nursing note and vitals reviewed. Constitutional: He is oriented to person, place, and time. He appears well-developed and well-nourished.  HENT:  Head: Normocephalic.  Nose: Nose normal.  Mouth/Throat: Oropharynx is clear and moist.  Eyes: Conjunctivae are normal. Pupils are equal, round, and reactive to light.  Neck: Normal range of motion. Neck supple. No JVD present.  Cardiovascular: Normal rate, regular rhythm, S1 normal, S2 normal, normal heart sounds and intact distal pulses.  Exam reveals no gallop and no friction rub.   No murmur heard. Pulmonary/Chest: Effort normal and  breath sounds normal. No respiratory distress. He has no wheezes. He has no rales. He exhibits no tenderness.  Abdominal: Soft. Bowel sounds are normal. He exhibits no distension. There is no tenderness.  Musculoskeletal: Normal range of motion. He exhibits no edema and no tenderness.  Lymphadenopathy:    He has no cervical adenopathy.  Neurological: He is alert and oriented to person, place, and time. Coordination normal.  Skin: Skin is warm and dry. No rash noted. No erythema.  Psychiatric: He has a normal mood and affect. His behavior is normal.  Judgment and thought content normal.      Assessment and Plan

## 2013-01-10 NOTE — Assessment & Plan Note (Signed)
Stable disease after recent cardiac catheterization. Continue current medications

## 2013-01-10 NOTE — Assessment & Plan Note (Signed)
Cholesterol is at goal on the current lipid regimen. No changes to the medications were made.  

## 2013-02-19 ENCOUNTER — Other Ambulatory Visit: Payer: Self-pay | Admitting: *Deleted

## 2013-02-19 ENCOUNTER — Telehealth: Payer: Self-pay

## 2013-02-19 MED ORDER — EZETIMIBE 10 MG PO TABS: 10.0000 mg | ORAL_TABLET | Freq: Every day | ORAL | Status: DC

## 2013-02-19 MED ORDER — CLOPIDOGREL BISULFATE 75 MG PO TABS: 75.0000 mg | ORAL_TABLET | Freq: Every day | ORAL | Status: DC

## 2013-02-19 MED ORDER — METOPROLOL SUCCINATE ER 25 MG PO TB24: 25.0000 mg | ORAL_TABLET | Freq: Every day | ORAL | Status: DC

## 2013-02-19 NOTE — Telephone Encounter (Signed)
Brooke from Pharmacy calling to inform us pt is taking Prozac and plavix. Says the Prozac tends to inhibit the reabsorption of plavix and wanted Dr. Mariah Milling to be aware

## 2013-02-19 NOTE — Telephone Encounter (Signed)
Refilled Zetia, Metoprolol and Clopidogrel sent to [facility].

## 2013-02-20 NOTE — Telephone Encounter (Signed)
Okay to continue both medications. No recent stent placement

## 2013-02-20 NOTE — Telephone Encounter (Signed)
Pharmacist made aware Understanding verb

## 2013-09-11 ENCOUNTER — Ambulatory Visit: Payer: Self-pay | Admitting: Ophthalmology

## 2013-09-11 DIAGNOSIS — I10 Essential (primary) hypertension: Secondary | ICD-10-CM

## 2013-09-15 ENCOUNTER — Ambulatory Visit: Payer: Self-pay | Admitting: Ophthalmology

## 2014-03-02 ENCOUNTER — Other Ambulatory Visit: Payer: Self-pay | Admitting: *Deleted

## 2014-03-02 MED ORDER — CLOPIDOGREL BISULFATE 75 MG PO TABS: 75.0000 mg | ORAL_TABLET | Freq: Every day | ORAL | Status: DC

## 2014-03-02 MED ORDER — METOPROLOL SUCCINATE ER 25 MG PO TB24: 25.0000 mg | ORAL_TABLET | Freq: Every day | ORAL | Status: DC

## 2014-03-02 MED ORDER — EZETIMIBE 10 MG PO TABS: 10.0000 mg | ORAL_TABLET | Freq: Every day | ORAL | Status: DC

## 2014-03-02 NOTE — Telephone Encounter (Signed)
Requested Prescriptions   Signed Prescriptions Disp Refills  . clopidogrel (PLAVIX) 75 MG tablet 30 tablet 1    Sig: Take 1 tablet (75 mg total) by mouth daily.    Authorizing Provider: Antonieta IbaGOLLAN, TIMOTHY J    Ordering User: Shawnie DapperLOPEZ, MARINA C  . ezetimibe (ZETIA) 10 MG tablet 30 tablet 1    Sig: Take 1 tablet (10 mg total) by mouth daily.    Authorizing Provider: Antonieta IbaGOLLAN, TIMOTHY J    Ordering User: Shawnie DapperLOPEZ, MARINA C  . metoprolol succinate (TOPROL-XL) 25 MG 24 hr tablet 30 tablet 1    Sig: Take 1 tablet (25 mg total) by mouth daily.    Authorizing Provider: Antonieta IbaGOLLAN, TIMOTHY J    Ordering User: Kendrick FriesLOPEZ, MARINA C

## 2014-06-15 ENCOUNTER — Ambulatory Visit: Payer: Self-pay | Admitting: Internal Medicine

## 2014-06-19 ENCOUNTER — Other Ambulatory Visit: Payer: Self-pay | Admitting: *Deleted

## 2014-06-19 MED ORDER — CLOPIDOGREL BISULFATE 75 MG PO TABS: 75.0000 mg | ORAL_TABLET | Freq: Every day | ORAL | Status: DC

## 2014-06-19 MED ORDER — METOPROLOL SUCCINATE ER 25 MG PO TB24: 25.0000 mg | ORAL_TABLET | Freq: Every day | ORAL | Status: DC

## 2014-06-19 MED ORDER — EZETIMIBE 10 MG PO TABS: 10.0000 mg | ORAL_TABLET | Freq: Every day | ORAL | Status: DC

## 2014-12-05 NOTE — Op Note (Signed)
PATIENT NAME:  Joshua RobertsSLOTT, Haley D MR#:  [id number] DATE OF BIRTH:  [birth year]  DATE OF PROCEDURE:  09/15/2013  PREOPERATIVE DIAGNOSIS:  Cataract, left eye.    POSTOPERATIVE DIAGNOSIS:  Cataract, left eye.  PROCEDURE PERFORMED:  Extracapsular cataract extraction using phacoemulsification with placement of an Alcon [id number], 20.5-diopter posterior chamber lens, serial number [id number].  SURGEON:  Maylon PeppersSteven A. Sahil Milner, MD  ASSISTANT:  None.  ANESTHESIA:  4% lidocaine and 0.75% Marcaine in a 50/50 mixture with 10 units/mL of Hylenex added, given as a peribulbar.  ANESTHESIOLOGIST:  Naomie DeanWilliam K. Kephart, MD  COMPLICATIONS:  None.  ESTIMATED BLOOD LOSS:  Less than 1 ml.  DESCRIPTION OF PROCEDURE:  The patient was brought to the operating room and given a peribulbar block.  The patient was then prepped and draped in the usual fashion.  The vertical rectus muscles were imbricated using 5-0 silk sutures.  These sutures were then clamped to the sterile drapes as bridle sutures.  A limbal peritomy was performed extending two clock hours and hemostasis was obtained with cautery.  A partial thickness scleral groove was made at the surgical limbus and dissected anteriorly in a lamellar dissection using an Alcon crescent knife.  The anterior chamber was entered supero-temporally with a Superblade and through the lamellar dissection with a 2.6 mm keratome.  DisCoVisc was used to replace the aqueous and a continuous tear capsulorrhexis was carried out.  Hydrodissection and hydrodelineation were carried out with balanced salt and a 27 gauge canula.  The nucleus was rotated to confirm the effectiveness of the hydrodissection.  Phacoemulsification was carried out using a divide-and-conquer technique.  Total ultrasound time was 1 minute and 27 seconds with an average power of 20.6 percent.  CDE 31.35.    Irrigation/aspiration was used to remove the residual cortex.  DisCoVisc was used to inflate the capsule and the  internal incision was enlarged to 3 mm with the crescent knife.  The intraocular lens was folded and inserted into the capsular bag using the AcrySert delivery system.  Irrigation/aspiration was used to remove the residual DisCoVisc.  Miostat was injected into the anterior chamber through the paracentesis track to inflate the anterior chamber and induce miosis.  Cefuroxime 0.1 mL containing 1 mg of drug was injected via the paracentesis track.  The wound was checked for leaks and none were found. No suture was added.  The conjunctiva was closed with cautery and the bridle sutures were removed.  Two drops of 0.3% Vigamox were placed on the eye.   An eye shield was placed on the eye.  The patient was discharged to the recovery room in good condition.      ____________________________ Maylon PeppersSteven A. Patsy Zaragoza, MD sad:cs D: 09/15/2013 12:38:41 ET T: 09/15/2013 14:26:36 ET JOB#: [id number]  cc: Viviann SpareSteven A. Kamdon Reisig, MD, <Dictator> Erline LevineSTEVEN A Elnoria Livingston MD ELECTRONICALLY SIGNED 09/22/2013 10:14

## 2014-12-18 IMAGING — CR DG CHEST 2V
1 series · 2 of 2 positions shown · non-contrast
Comparison: none

REASON FOR EXAM: SOB, Chest Pain
COMMENTS:

PROCEDURE:     DXR - DXR CHEST PA (OR AP) AND LATERAL  - November 22, 2012  [DATE]
RESULT:     Comparison: None

[Series 2: w chest lat · 0.14mm/px · 2 of 2 slices shown]
[im 1/2]
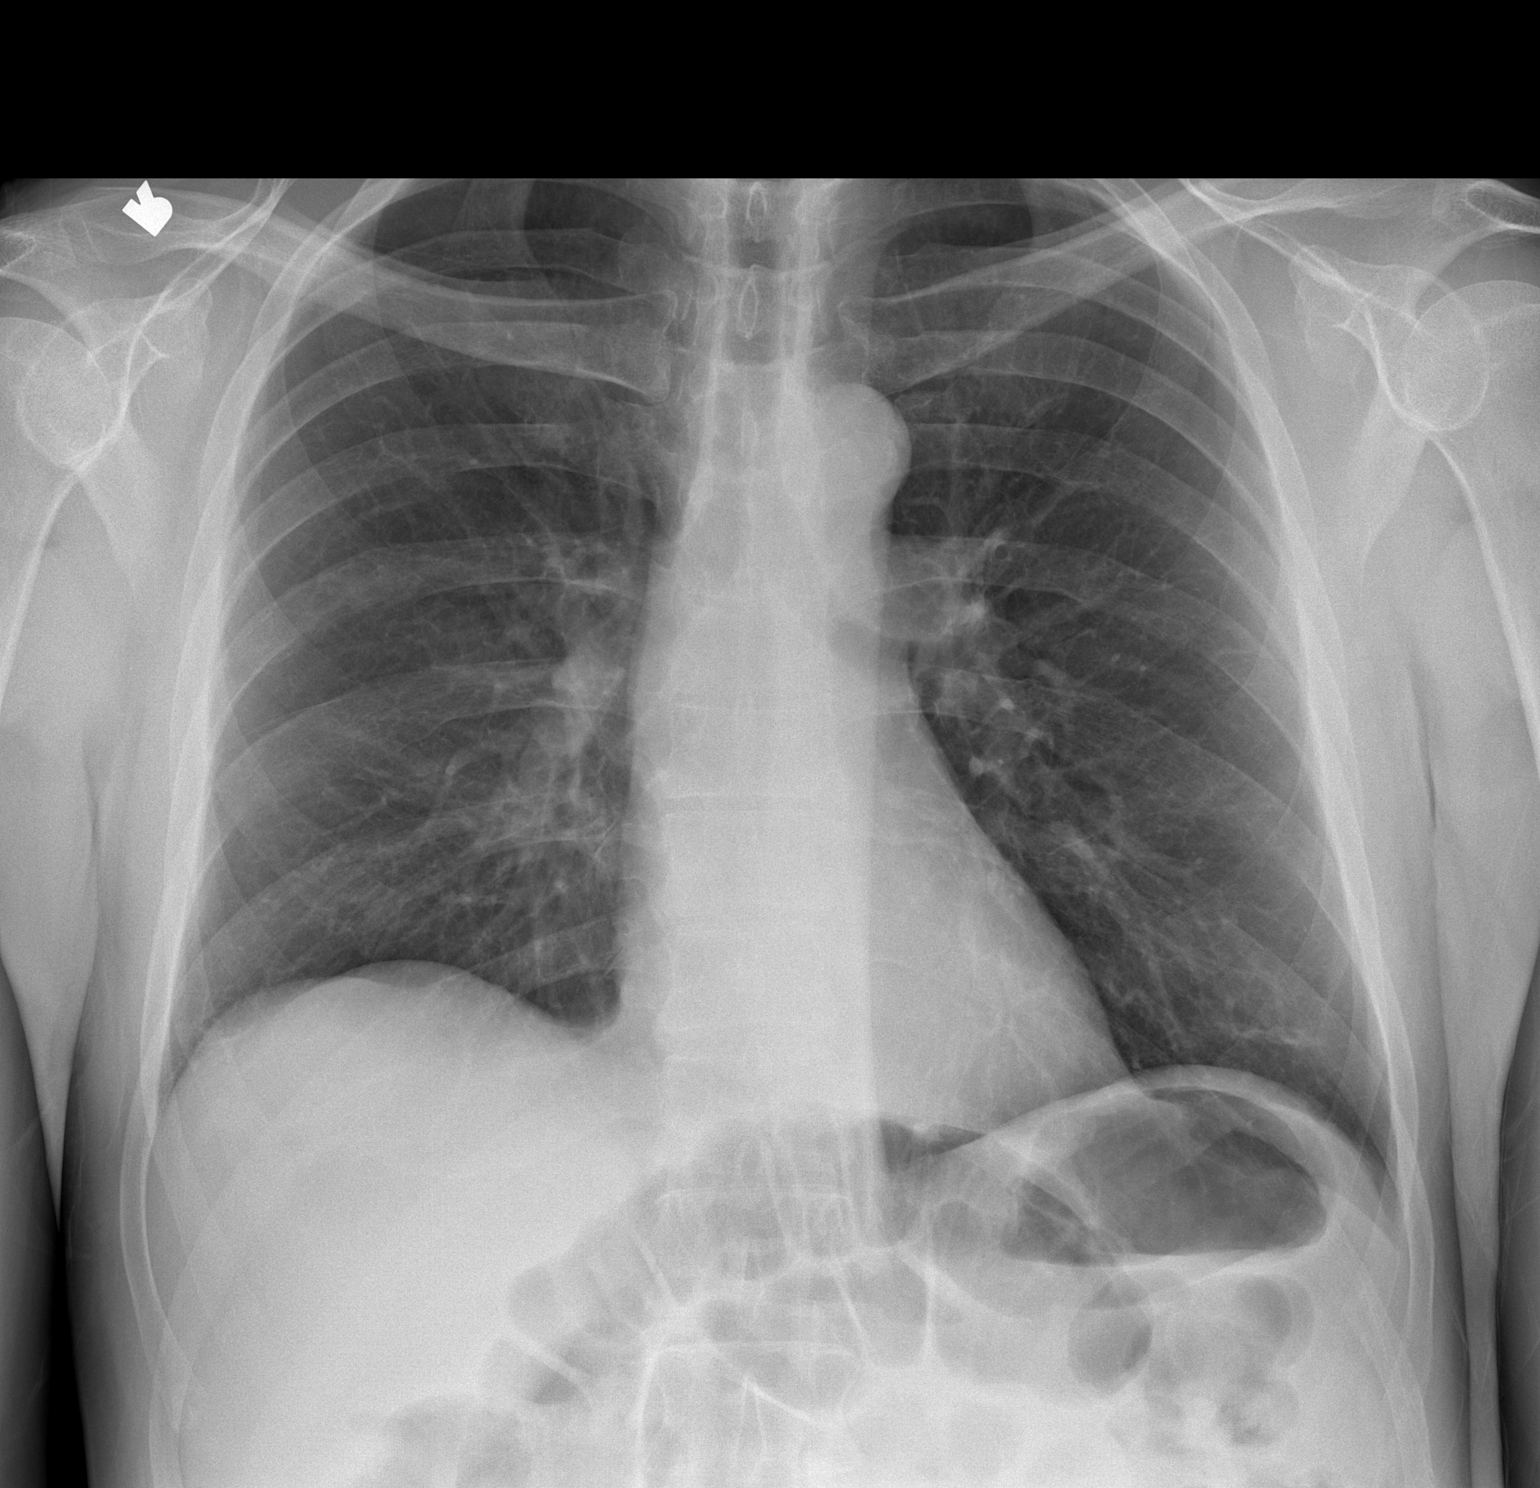
[im 2/2]
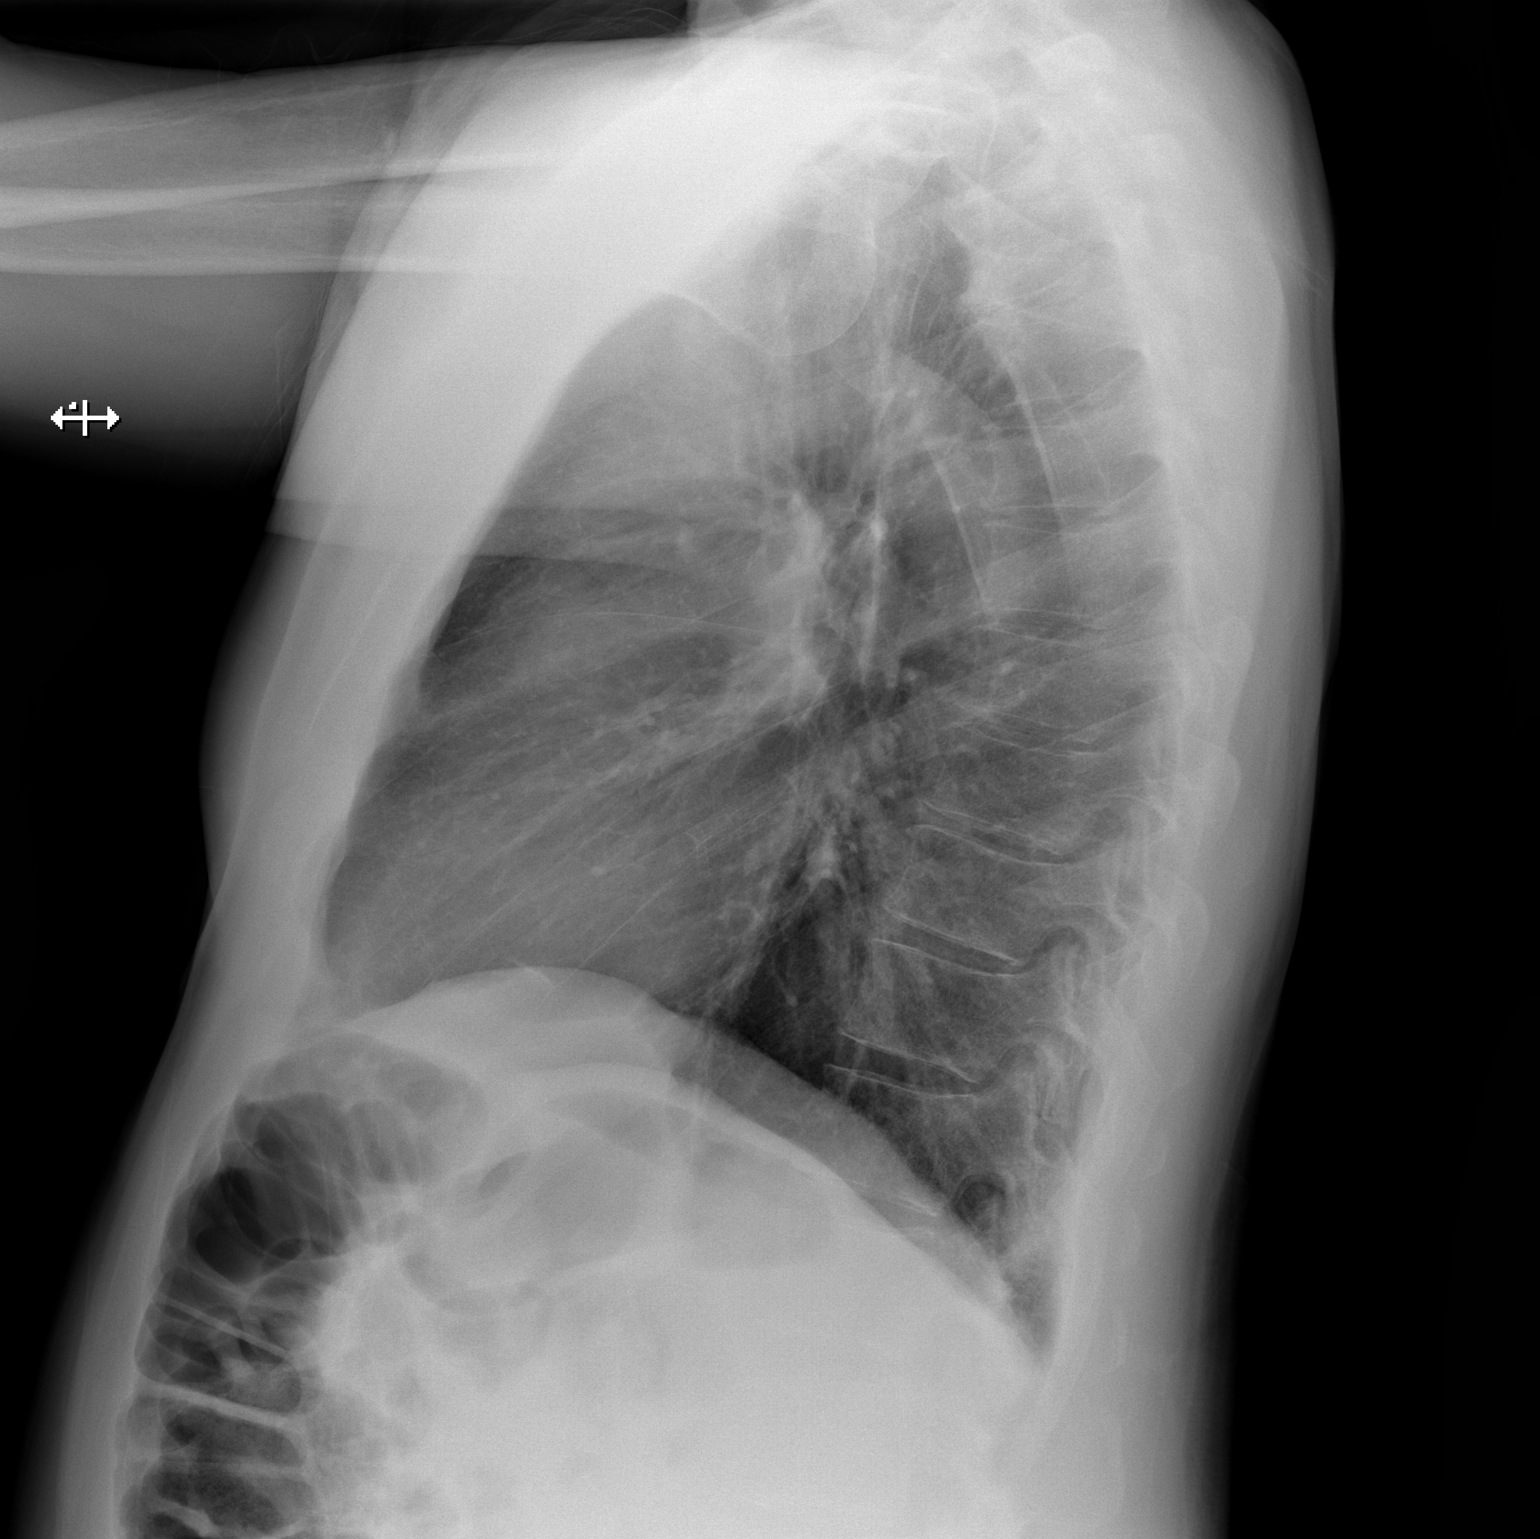

[2 of 2 positions shown; findings below may reference images not displayed]

FINDINGS: PA and lateral chest radiographs are provided.  There is no focal
parenchymal opacity, pleural effusion, or pneumothorax. The heart and
mediastinum are unremarkable.  The osseous structures are unremarkable.
IMPRESSION: No acute disease of the che[REDACTED]

## 2015-01-13 ENCOUNTER — Telehealth: Payer: Self-pay

## 2015-01-13 NOTE — Telephone Encounter (Signed)
LMOM to have patient contact our office to schedule a past due follow up with Dr. Mariah MillingGollan; last seen in 2014.

## 2015-01-15 NOTE — Telephone Encounter (Signed)
Please contact patient for a follow up appointment with Dr. Mariah MillingGollan. Patient is calling with refills on medications.

## 2015-01-18 NOTE — Telephone Encounter (Signed)
LMOM to schedule f/u appt.  °

## 2015-01-25 MED ORDER — METOPROLOL SUCCINATE ER 25 MG PO TB24: 25.0000 mg | ORAL_TABLET | Freq: Every day | ORAL | Status: DC

## 2015-01-25 MED ORDER — CLOPIDOGREL BISULFATE 75 MG PO TABS: 75.0000 mg | ORAL_TABLET | Freq: Every day | ORAL | Status: DC

## 2015-01-25 MED ORDER — EZETIMIBE 10 MG PO TABS: 10.0000 mg | ORAL_TABLET | Freq: Every day | ORAL | Status: DC

## 2015-01-25 NOTE — Telephone Encounter (Signed)
Pt wife called made pt an apt for July 29 at 11:40 am They are completely out on medications.  Would like [place] to send just enough to last until apt. Only need medication that Dr Mariah Milling Prescribed to him. Please send to [facility]

## 2015-01-25 NOTE — Telephone Encounter (Signed)
Refill sent for zetia, plavix and metoprolol

## 2015-03-11 ENCOUNTER — Other Ambulatory Visit: Payer: Self-pay

## 2015-03-11 MED ORDER — CLOPIDOGREL BISULFATE 75 MG PO TABS: 75.0000 mg | ORAL_TABLET | Freq: Every day | ORAL | Status: DC

## 2015-03-11 MED ORDER — EZETIMIBE 10 MG PO TABS: 10.0000 mg | ORAL_TABLET | Freq: Every day | ORAL | Status: DC

## 2015-03-11 MED ORDER — METOPROLOL SUCCINATE ER 25 MG PO TB24: 25.0000 mg | ORAL_TABLET | Freq: Every day | ORAL | Status: DC

## 2015-03-11 NOTE — Telephone Encounter (Signed)
Refill sent for zetia.  

## 2015-03-11 NOTE — Telephone Encounter (Signed)
Refill sent for metoprolol succ 

## 2015-03-11 NOTE — Telephone Encounter (Signed)
Refill sent for plavix  

## 2015-03-12 ENCOUNTER — Ambulatory Visit (INDEPENDENT_AMBULATORY_CARE_PROVIDER_SITE_OTHER): Payer: BC Managed Care – PPO | Admitting: Cardiovascular Disease

## 2015-03-12 ENCOUNTER — Encounter: Payer: Self-pay | Admitting: Cardiovascular Disease

## 2015-03-12 VITALS — BP 140/88 | HR 67 | Ht 72.0 in | Wt 185.0 lb

## 2015-03-12 DIAGNOSIS — I10 Essential (primary) hypertension: Secondary | ICD-10-CM | POA: Diagnosis not present

## 2015-03-12 DIAGNOSIS — I25119 Atherosclerotic heart disease of native coronary artery with unspecified angina pectoris: Secondary | ICD-10-CM

## 2015-03-12 DIAGNOSIS — E785 Hyperlipidemia, unspecified: Secondary | ICD-10-CM

## 2015-03-12 NOTE — Progress Notes (Signed)
Patient ID: Joshua Horn, male    DOB: Dec 18, 1950, 64 y.o.   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  He had a 2.5 x 18 mm Promus stent placed in the OM with good results.  He had chest pain symptoms in 2009 with repeat catheterization showing no significant progression of his disease . He presents for routine followup  Of his coronary artery disease  In follow-up today, he denies any symptoms concerning for angina He has been noncompliant with his cholesterol medication Last cholesterol with LDL 120s. Now back on his medication Not been exercising as much due to the weather  EKG on today's visit shows normal sinus rhythm with rate 67 bpm, no significant ST or T-wave changes  Other past medical history  Cardiac catheterization  11/29/2012. This showed chronically occluded proximal RCA, moderate distal left circumflex disease, OM1 stent was patent with no in-stent restenosis, LAD with mild to moderate mid disease, long region, normal ejection fraction estimated at greater than 55%   Allergies  Allergen Reactions  . Sulfa Antibiotics     rash    Current Outpatient Prescriptions on File Prior to Visit  Medication Sig Dispense Refill  . clopidogrel (PLAVIX) 75 MG tablet Take 1 tablet (75 mg total) by mouth daily. 30 tablet 1  . ezetimibe (ZETIA) 10 MG tablet Take 1 tablet (10 mg total) by mouth daily. 30 tablet 0  . FLUoxetine (PROZAC) 20 MG tablet Take 20 mg by mouth 2 (two) times daily.    . methylphenidate (CONCERTA) 54 MG CR tablet Take 54 mg by mouth every morning.    . metoprolol succinate (TOPROL-XL) 25 MG 24 hr tablet Take 1 tablet (25 mg total) by mouth daily. 30 tablet 0  . nitroGLYCERIN (NITROSTAT) 0.4 MG  SL tablet Place 1 tablet (0.4 mg total) under the tongue every 5 (five) minutes as needed for chest pain. 25 tablet 6  . rosuvastatin (CRESTOR) 20 MG tablet Take 1 tablet (20 mg total) by mouth at bedtime. 30 tablet 11   No current facility-administered medications on file prior to visit.    Past Medical History  Diagnosis Date  . Hyperlipidemia   . Coronary artery disease     s/p stent     Past Surgical History  Procedure Laterality Date  . Cardiac catheterization  11/29/2012  . Coronary angioplasty      Social History  reports that he has never smoked. He does not have any smokeless tobacco history on file. He reports that he does not drink alcohol or use illicit drugs.  Family History Family history is unknown by patient.   Review of Systems  Constitutional: Negative.   HENT: Negative.   Eyes: Negative.   Gastrointestinal: Negative.   Musculoskeletal: Negative.   Skin: Negative.   Neurological: Negative.   Psychiatric/Behavioral: Negative.   All other systems reviewed and are negative.  BP 140/88 mmHg  Pulse 67  Ht 6' (1.829 m)  Wt 185 lb (83.915 kg)  BMI 25.08 kg/m2  Physical Exam  Constitutional: He is oriented to person, place, and time. He appears well-developed and well-nourished.  HENT:  Head: Normocephalic.  Nose: Nose normal.  Mouth/Throat: Oropharynx is clear and moist.  Eyes: Conjunctivae  are normal. Pupils are equal, round, and reactive to light.  Neck: Normal range of motion. Neck supple. No JVD present.  Cardiovascular: Normal rate, regular rhythm, S1 normal, S2 normal, normal heart sounds and intact distal pulses.  Exam reveals no gallop and no friction rub.   No murmur heard. Pulmonary/Chest: Effort normal and breath sounds normal. No respiratory distress. He has no wheezes. He has no rales. He exhibits no tenderness.  Abdominal: Soft. Bowel sounds are normal. He exhibits no distension. There is no tenderness.  Musculoskeletal: Normal range of  motion. He exhibits no edema or tenderness.  Lymphadenopathy:    He has no cervical adenopathy.  Neurological: He is alert and oriented to person, place, and time. Coordination normal.  Skin: Skin is warm and dry. No rash noted. No erythema.  Psychiatric: He has a normal mood and affect. His behavior is normal. Judgment and thought content normal.      Assessment and Plan   Nursing note and vitals reviewed.

## 2015-03-12 NOTE — Assessment & Plan Note (Signed)
Blood pressure is well controlled on today's visit. No changes made to the medications. 

## 2015-03-12 NOTE — Patient Instructions (Signed)
You are doing well. No medication changes were made.  Please call us if you have new issues that need to be addressed before your next appt.  Your physician wants you to follow-up in: 12 months.  You will receive a reminder letter in the mail two months in advance. If you don't receive a letter, please call our office to schedule the follow-up appointment. 

## 2015-03-12 NOTE — Assessment & Plan Note (Addendum)
Encouraged him to stay on his Crestor and zetia.

## 2015-03-12 NOTE — Assessment & Plan Note (Signed)
Currently with no symptoms of angina. No further workup at this time. Continue current medication regimen. 

## 2015-06-11 ENCOUNTER — Other Ambulatory Visit: Payer: Self-pay | Admitting: *Deleted

## 2015-06-11 MED ORDER — EZETIMIBE 10 MG PO TABS: 10.0000 mg | ORAL_TABLET | Freq: Every day | ORAL | Status: DC

## 2015-06-11 MED ORDER — METOPROLOL SUCCINATE ER 25 MG PO TB24: 25.0000 mg | ORAL_TABLET | Freq: Every day | ORAL | Status: DC

## 2015-06-11 MED ORDER — CLOPIDOGREL BISULFATE 75 MG PO TABS
75.0000 mg | ORAL_TABLET | Freq: Every day | ORAL | Status: DC
Start: 2015-06-11 — End: 2016-03-29

## 2015-08-15 DIAGNOSIS — K259 Gastric ulcer, unspecified as acute or chronic, without hemorrhage or perforation: Secondary | ICD-10-CM

## 2015-08-15 HISTORY — DX: Gastric ulcer, unspecified as acute or chronic, without hemorrhage or perforation: K25.9

## 2015-09-26 ENCOUNTER — Encounter: Payer: Self-pay | Admitting: Emergency Medicine

## 2015-09-26 ENCOUNTER — Emergency Department
Admission: EM | Admit: 2015-09-26 | Discharge: 2015-09-26 | Disposition: A | Discharge status: Home / Self Care | Payer: BC Managed Care – PPO | Attending: Emergency Medicine | Admitting: Emergency Medicine

## 2015-09-26 ENCOUNTER — Emergency Department: Payer: BC Managed Care – PPO

## 2015-09-26 DIAGNOSIS — Z7902 Long term (current) use of antithrombotics/antiplatelets: Secondary | ICD-10-CM | POA: Diagnosis not present

## 2015-09-26 DIAGNOSIS — R112 Nausea with vomiting, unspecified: Secondary | ICD-10-CM | POA: Insufficient documentation

## 2015-09-26 DIAGNOSIS — R1031 Right lower quadrant pain: Secondary | ICD-10-CM | POA: Diagnosis not present

## 2015-09-26 DIAGNOSIS — Z79899 Other long term (current) drug therapy: Secondary | ICD-10-CM | POA: Insufficient documentation

## 2015-09-26 DIAGNOSIS — M545 Low back pain: Secondary | ICD-10-CM | POA: Insufficient documentation

## 2015-09-26 DIAGNOSIS — I10 Essential (primary) hypertension: Secondary | ICD-10-CM | POA: Insufficient documentation

## 2015-09-26 DIAGNOSIS — R109 Unspecified abdominal pain: Secondary | ICD-10-CM

## 2015-09-26 LAB — COMPREHENSIVE METABOLIC PANEL
ALT: 18 U/L (ref 17–63)
ANION GAP: 8 (ref 5–15)
AST: 20 U/L (ref 15–41)
Albumin: 4 g/dL (ref 3.5–5.0)
Alkaline Phosphatase: 89 U/L (ref 38–126)
BUN: 18 mg/dL (ref 6–20)
CALCIUM: 9.2 mg/dL (ref 8.9–10.3)
CHLORIDE: 107 mmol/L (ref 101–111)
CO2: 23 mmol/L (ref 22–32)
Creatinine, Ser: 0.84 mg/dL (ref 0.61–1.24)
Glucose, Bld: 129 mg/dL — ABNORMAL HIGH (ref 65–99)
POTASSIUM: 4 mmol/L (ref 3.5–5.1)
Sodium: 138 mmol/L (ref 135–145)
Total Bilirubin: 0.6 mg/dL (ref 0.3–1.2)
Total Protein: 7.7 g/dL (ref 6.5–8.1)

## 2015-09-26 LAB — CBC
HCT: 41.8 % (ref 40.0–52.0)
Hemoglobin: 14.3 g/dL (ref 13.0–18.0)
MCH: 30.2 pg (ref 26.0–34.0)
MCHC: 34.2 g/dL (ref 32.0–36.0)
MCV: 88.3 fL (ref 80.0–100.0)
PLATELETS: 322 10*3/uL (ref 150–440)
RBC: 4.74 MIL/uL (ref 4.40–5.90)
RDW: 13.5 % (ref 11.5–14.5)
WBC: 9.8 10*3/uL (ref 3.8–10.6)

## 2015-09-26 LAB — URINALYSIS COMPLETE WITH MICROSCOPIC (ARMC ONLY)
Bacteria, UA: NONE SEEN
Bilirubin Urine: NEGATIVE
Glucose, UA: NEGATIVE mg/dL
KETONES UR: NEGATIVE mg/dL
Leukocytes, UA: NEGATIVE
Nitrite: NEGATIVE
PH: 5 (ref 5.0–8.0)
PROTEIN: NEGATIVE mg/dL
Specific Gravity, Urine: 1.024 (ref 1.005–1.030)

## 2015-09-26 LAB — LIPASE, BLOOD: LIPASE: 30 U/L (ref 11–51)

## 2015-09-26 MED ORDER — ONDANSETRON HCL 4 MG/2ML IJ SOLN
4.0000 mg | Freq: Once | INTRAMUSCULAR | Status: AC
  Administered 2015-09-26: 4 mg via INTRAVENOUS
  Filled 2015-09-26: qty 2

## 2015-09-26 MED ORDER — SODIUM CHLORIDE 0.9 % IV BOLUS (SEPSIS)
1000.0000 mL | Freq: Once | INTRAVENOUS | Status: AC
  Administered 2015-09-26: 1000 mL via INTRAVENOUS

## 2015-09-26 MED ORDER — KETOROLAC TROMETHAMINE 10 MG PO TABS: 10.0000 mg | ORAL_TABLET | Freq: Three times a day (TID) | ORAL | Status: DC | PRN

## 2015-09-26 MED ORDER — KETOROLAC TROMETHAMINE 30 MG/ML IJ SOLN
30.0000 mg | Freq: Once | INTRAMUSCULAR | Status: AC
  Administered 2015-09-26: 30 mg via INTRAVENOUS
  Filled 2015-09-26: qty 1

## 2015-09-26 MED ORDER — ONDANSETRON 4 MG PO TBDP: 4.0000 mg | ORAL_TABLET | Freq: Three times a day (TID) | ORAL | Status: DC | PRN

## 2015-09-26 NOTE — Discharge Instructions (Signed)
Please take a BRAT diet for the next 2-3 days, then advance your diet as tolerated. You may take Toradol, with food, for ear pain. Do not take any other NSAID medications such as ibuprofen, Motrin, Advil or Aleve when you're taking Toradol. You may take Zofran for nausea.  Please make a follow-up appointment with your primary care physician.  Return to the emergency department if you develop worsening pain, fever, inability to keep down fluids, or any other symptoms concerning to you.  Abdominal Pain, Adult Many things can cause abdominal pain. Usually, abdominal pain is not caused by a disease and will improve without treatment. It can often be observed and treated at home. Your health care provider will do a physical exam and possibly order blood tests and X-rays to help determine the seriousness of your pain. However, in many cases, more time must pass before a clear cause of the pain can be found. Before that point, your health care provider may not know if you need more testing or further treatment. HOME CARE INSTRUCTIONS Monitor your abdominal pain for any changes. The following actions may help to alleviate any discomfort you are experiencing:  Only take over-the-counter or prescription medicines as directed by your health care provider.  Do not take laxatives unless directed to do so by your health care provider.  Try a clear liquid diet (broth, tea, or water) as directed by your health care provider. Slowly move to a bland diet as tolerated. SEEK MEDICAL CARE IF:  You have unexplained abdominal pain.  You have abdominal pain associated with nausea or diarrhea.  You have pain when you urinate or have a bowel movement.  You experience abdominal pain that wakes you in the night.  You have abdominal pain that is worsened or improved by eating food.  You have abdominal pain that is worsened with eating fatty foods.  You have a fever. SEEK IMMEDIATE MEDICAL CARE IF:  Your pain  does not go away within 2 hours.  You keep throwing up (vomiting).  Your pain is felt only in portions of the abdomen, such as the right side or the left lower portion of the abdomen.  You pass bloody or black tarry stools. MAKE SURE YOU:  Understand these instructions.  Will watch your condition.  Will get help right away if you are not doing well or get worse.   This information is not intended to replace advice given to you by your health care provider. Make sure you discuss any questions you have with your health care provider.   Document Released: 05/10/2005 Document Revised: 04/21/2015 Document Reviewed: 04/09/2013 Elsevier Interactive Patient Education 2016 [employer]  Flank Pain Flank pain refers to pain that is located on the side of the body between the upper abdomen and the back. The pain may occur over a short period of time (acute) or may be long-term or reoccurring (chronic). It may be mild or severe. Flank pain can be caused by many things. CAUSES  Some of the more common causes of flank pain include:  Muscle strains.   Muscle spasms.   A disease of your spine (vertebral disk disease).   A lung infection (pneumonia).   Fluid around your lungs (pulmonary edema).   A kidney infection.   Kidney stones.   A very painful skin rash caused by the chickenpox virus (shingles).   Gallbladder disease.  HOME CARE INSTRUCTIONS  Home care will depend on the cause of your pain. In general,  Rest  as directed by your caregiver.  Drink enough fluids to keep your urine clear or pale yellow.  Only take over-the-counter or prescription medicines as directed by your caregiver. Some medicines may help relieve the pain.  Tell your caregiver about any changes in your pain.  Follow up with your caregiver as directed. SEEK IMMEDIATE MEDICAL CARE IF:   Your pain is not controlled with medicine.   You have new or worsening symptoms.  Your pain increases.    You have abdominal pain.   You have shortness of breath.   You have persistent nausea or vomiting.   You have swelling in your abdomen.   You feel faint or pass out.   You have blood in your urine.  You have a fever or persistent symptoms for more than 2-3 days.  You have a fever and your symptoms suddenly get worse. MAKE SURE YOU:   Understand these instructions.  Will watch your condition.  Will get help right away if you are not doing well or get worse.   This information is not intended to replace advice given to you by your health care provider. Make sure you discuss any questions you have with your health care provider.   Document Released: 09/21/2005 Document Revised: 04/24/2012 Document Reviewed: 03/14/2012 Elsevier Interactive Patient Education [employer]

## 2015-09-26 NOTE — ED Provider Notes (Signed)
[facility] Emergency Department Provider Note  ____________________________________________  Time seen: Approximately 9:40 AM  I have reviewed the triage vital signs and the nursing notes.   HISTORY  Chief Complaint Abdominal Pain    HPI Joshua Horn is a 65 y.o. male with a history of CAD presenting with right lower quadrant pain. Patient reports that he had right lower and right flank pain 4 days ago, which she has intermittently, but this time it did not improve with Motrin. It lasted a proximal 91 day. Today he woke up with a "sharp" right lower quadrant pain with associated nausea and vomiting. He states that over the past several months he has had this similar back pain and right lower quadrant pain but he has always self resolved. He denies any urinary symptoms, hematuria, fever, chills, diarrhea. Last bowel movement was 2 days ago and it was normal. He has no history of abdominal surgery. He denies any penile pain or discharge, testicular or scrotal pain or swelling.   Past Medical History  Diagnosis Date  . Hyperlipidemia   . Coronary artery disease     s/p stent     Patient Active Problem List   Diagnosis Date Noted  . Chest pain 11/22/2012  . CAD (coronary artery disease) 02/09/2012  . Hypertension 02/09/2012  . Hyperlipidemia 02/09/2012    Past Surgical History  Procedure Laterality Date  . Cardiac catheterization  11/29/2012  . Coronary angioplasty      Current Outpatient Rx  Name  Route  Sig  Dispense  Refill  . clopidogrel (PLAVIX) 75 MG tablet   Oral   Take 1 tablet (75 mg total) by mouth daily.   30 tablet   8   . ezetimibe (ZETIA) 10 MG tablet   Oral   Take 1 tablet (10 mg total) by mouth daily.   30 tablet   8   . FLUoxetine (PROZAC) 20 MG tablet   Oral   Take 20 mg by mouth 2 (two) times daily.         Marland Kitchen ketorolac (TORADOL) 10 MG tablet   Oral   Take 1 tablet (10 mg total) by mouth every 8 (eight) hours as  needed for moderate pain (with food).   12 tablet   0   . methylphenidate (CONCERTA) 54 MG CR tablet   Oral   Take 54 mg by mouth every morning.         . metoprolol succinate (TOPROL-XL) 25 MG 24 hr tablet   Oral   Take 1 tablet (25 mg total) by mouth daily.   30 tablet   8   . nitroGLYCERIN (NITROSTAT) 0.4 MG SL tablet   Sublingual   Place 1 tablet (0.4 mg total) under the tongue every 5 (five) minutes as needed for chest pain.   25 tablet   6   . ondansetron (ZOFRAN ODT) 4 MG disintegrating tablet   Oral   Take 1 tablet (4 mg total) by mouth every 8 (eight) hours as needed for nausea or vomiting.   20 tablet   0   . rosuvastatin (CRESTOR) 20 MG tablet   Oral   Take 1 tablet (20 mg total) by mouth at bedtime.   30 tablet   11     Patient needs to make appointment.     Allergies Sulfa antibiotics  Family History  Problem Relation Age of Onset  . Family history unknown: Yes    Social History Social History  Substance Use Topics  . Smoking status: Never Smoker   . Smokeless tobacco: None  . Alcohol Use: No    Review of Systems Constitutional: No fever/chills. No lightheadedness or syncope. Eyes: No visual changes. ENT: No sore throat. Cardiovascular: Denies chest pain, palpitations. Respiratory: Denies shortness of breath.  No cough. Gastrointestinal: Positive right lower back and right lower quadrant abdominal pain.  Positive nausea, positive vomiting.  No diarrhea.  No constipation. Genitourinary: Negative for dysuria. No penile or testicular pain or swelling. Musculoskeletal: Positive right flank and lower back pain. Skin: Negative for rash. Neurological: Negative for headaches, focal weakness or numbness.  10-point ROS otherwise negative.  ____________________________________________   PHYSICAL EXAM:  VITAL SIGNS: ED Triage Vitals  Enc Vitals Group     BP 09/26/15 0906 145/87 mmHg     Pulse Rate 09/26/15 0906 78     Resp 09/26/15 0906  16     Temp 09/26/15 0906 97.6 F (36.4 C)     Temp Source 09/26/15 0906 Oral     SpO2 09/26/15 0906 97 %     Weight 09/26/15 0906 180 lb (81.647 kg)     Height 09/26/15 0906 6' (1.829 m)     Head Cir --      Peak Flow --      Pain Score 09/26/15 0906 10     Pain Loc --      Pain Edu? --      Excl. in GC? --     Constitutional: Alert and oriented. Well appearing and in no acute distress. Answer question appropriately. Patient is able to move around comfortably. Eyes: Conjunctivae are normal.  EOMI. no scleral icterus. Head: Atraumatic. Nose: No congestion/rhinnorhea. Mouth/Throat: Mucous membranes are mildly dry.  Neck: No stridor.  Supple.   Cardiovascular: Normal rate, regular rhythm. No murmurs, rubs or gallops.  Respiratory: Normal respiratory effort.  No retractions. Lungs CTAB.  No wheezes, rales or ronchi. Gastrointestinal: Abdomen is soft, nontender nondistended. I am not able to reproduce the patient's pain. He has no guarding, rebound or peritoneal signs. Negative Murphy sign. Genitourinary: Normal-appearing penis without discharge. Testicles have a normal lie with the left testicle lower than the right. No palpable masses or tenderness to palpation. No inguinal hernias bilaterally. Musculoskeletal: No LE edema.  Neurologic:  Normal speech and language. No gross focal neurologic deficits are appreciated.  Skin:  Skin is warm, dry and intact. No rash noted. Psychiatric: Mood and affect are normal. Speech and behavior are normal.  Normal judgement.  ____________________________________________   LABS (all labs ordered are listed, but only abnormal results are displayed)  Labs Reviewed  COMPREHENSIVE METABOLIC PANEL - Abnormal; Notable for the following:    Glucose, Bld 129 (*)    All other components within normal limits  URINALYSIS COMPLETEWITH MICROSCOPIC ([facility] ONLY) - Abnormal; Notable for the following:    Color, Urine YELLOW (*)    APPearance CLEAR (*)    Hgb  urine dipstick 1+ (*)    Squamous Epithelial / LPF 0-5 (*)    All other components within normal limits  CBC  LIPASE, BLOOD   ____________________________________________  EKG  Not indicated ____________________________________________  RADIOLOGY  Ct Renal Stone Study  09/26/2015  CLINICAL DATA:  Right lower quadrant/ right flank abdominal pain, nausea/vomiting EXAM: CT ABDOMEN AND PELVIS WITHOUT CONTRAST TECHNIQUE: Multidetector CT imaging of the abdomen and pelvis was performed following the standard protocol without IV contrast. COMPARISON:  None. FINDINGS: Lower chest:  Lung bases are clear.  Hepatobiliary: Unenhanced liver is unremarkable. Gallbladder is unremarkable. No intrahepatic or extrahepatic ductal dilatation. Pancreas: Within normal limits. Spleen: Within normal limits. Adrenals/Urinary Tract: Adrenal glands are within normal limits. Kidneys are within normal limits. No renal, ureteral, or bladder calculi.  No hydronephrosis. Bladder is within normal limits. Stomach/Bowel: Stomach is within normal limits. No evidence of bowel obstruction. Suspected appendix is normal (series 2/ images 52-53). Mild focal wall thickening involving a short segment of sigmoid colon (series 2/image 70), favored to reflect underdistention/peristalsis. Vascular/Lymphatic: Atherosclerotic calcifications of the abdominal aorta and branch vessels. No evidence of abdominal aortic aneurysm. No suspicious abdominopelvic lymphadenopathy. Reproductive: Prostate is unremarkable. Other: No abdominopelvic ascites. Musculoskeletal: Degenerative changes of the visualized thoracolumbar spine. IMPRESSION: No renal, ureteral, or bladder calculi.  No hydronephrosis. No evidence of bowel obstruction.  Normal appendix. No CT findings to account for the patient's right lower quadrant abdominal pain. Electronically Signed   By: Charline Bills M.D.   On: 09/26/2015 10:28     ____________________________________________   PROCEDURES  Procedure(s) performed: None  Critical Care performed: No ____________________________________________   INITIAL IMPRESSION / ASSESSMENT AND PLAN / ED COURSE  Pertinent labs & imaging results that were available during my care of the patient were reviewed by me and considered in my medical decision making (see chart for details).  65 y.o. male presenting with several months of right-sided low back pain and right lower quadrant pain, now with worsening pain associated with nausea and vomiting. He has had no fever. The patient has stable vital signs and is overall well-appearing and nontoxic. It is possible that he is having some symptoms consistent with renal colic so we will get a urinalysis as well as a CT scan. Consider early appendicitis. Plan basic labs, symptomatic treatment and reevaluation after diagnostic and therapeutic interventions are complete.  ----------------------------------------- 10:41 AM on 09/26/2015 -----------------------------------------  The patient's labs are reassuring, his vital signs remained stable. His CT scan does not show any causes for his pain. I will plan to send him home with symptom at a treatment and close PMD follow-up. He understands return precautions as well as follow-up instructions.  ____________________________________________  FINAL CLINICAL IMPRESSION(S) / ED DIAGNOSES  Final diagnoses:  Right flank pain  Right lower quadrant pain  Non-intractable vomiting with nausea, vomiting of unspecified type      NEW MEDICATIONS STARTED DURING THIS VISIT:  New Prescriptions   KETOROLAC (TORADOL) 10 MG TABLET    Take 1 tablet (10 mg total) by mouth every 8 (eight) hours as needed for moderate pain (with food).   ONDANSETRON (ZOFRAN ODT) 4 MG DISINTEGRATING TABLET    Take 1 tablet (4 mg total) by mouth every 8 (eight) hours as needed for nausea or vomiting.      Rockne Menghini, MD 09/26/15 1042

## 2015-09-26 NOTE — ED Notes (Addendum)
Pt reports abdominal pain on and off for months but began bad this morning with RLQ pain at 2 am.  Has had nausea and vomiting. Unsure if fever.

## 2015-09-26 NOTE — ED Notes (Signed)
Discussed discharge instructions, prescriptions, and follow-up care with patient. No questions or concerns at this time. Pt stable at discharge.  

## 2016-02-19 ENCOUNTER — Emergency Department
Admission: EM | Admit: 2016-02-19 | Discharge: 2016-02-20 | Disposition: A | Discharge status: Other Acute Inpatient Hospital | Payer: BC Managed Care – PPO | Attending: Emergency Medicine | Admitting: Emergency Medicine

## 2016-02-19 ENCOUNTER — Encounter: Payer: Self-pay | Admitting: Emergency Medicine

## 2016-02-19 DIAGNOSIS — K920 Hematemesis: Secondary | ICD-10-CM

## 2016-02-19 DIAGNOSIS — D649 Anemia, unspecified: Secondary | ICD-10-CM | POA: Insufficient documentation

## 2016-02-19 DIAGNOSIS — F909 Attention-deficit hyperactivity disorder, unspecified type: Secondary | ICD-10-CM | POA: Insufficient documentation

## 2016-02-19 DIAGNOSIS — E785 Hyperlipidemia, unspecified: Secondary | ICD-10-CM | POA: Insufficient documentation

## 2016-02-19 DIAGNOSIS — I2510 Atherosclerotic heart disease of native coronary artery without angina pectoris: Secondary | ICD-10-CM | POA: Insufficient documentation

## 2016-02-19 DIAGNOSIS — F329 Major depressive disorder, single episode, unspecified: Secondary | ICD-10-CM | POA: Insufficient documentation

## 2016-02-19 DIAGNOSIS — Z79899 Other long term (current) drug therapy: Secondary | ICD-10-CM | POA: Diagnosis not present

## 2016-02-19 DIAGNOSIS — R110 Nausea: Secondary | ICD-10-CM | POA: Diagnosis not present

## 2016-02-19 DIAGNOSIS — Z9861 Coronary angioplasty status: Secondary | ICD-10-CM | POA: Diagnosis not present

## 2016-02-19 HISTORY — DX: Major depressive disorder, single episode, unspecified: F32.9

## 2016-02-19 HISTORY — DX: Depression, unspecified: F32.A

## 2016-02-19 HISTORY — DX: Attention-deficit hyperactivity disorder, unspecified type: F90.9

## 2016-02-19 LAB — CBC
HCT: 29.9 % — ABNORMAL LOW (ref 40.0–52.0)
HEMOGLOBIN: 10.3 g/dL — AB (ref 13.0–18.0)
MCH: 30.6 pg (ref 26.0–34.0)
MCHC: 34.5 g/dL (ref 32.0–36.0)
MCV: 88.7 fL (ref 80.0–100.0)
PLATELETS: 333 10*3/uL (ref 150–440)
RBC: 3.37 MIL/uL — AB (ref 4.40–5.90)
RDW: 13.9 % (ref 11.5–14.5)
WBC: 8.8 10*3/uL (ref 3.8–10.6)

## 2016-02-19 LAB — COMPREHENSIVE METABOLIC PANEL
ALK PHOS: 97 U/L (ref 38–126)
ALT: 10 U/L — AB (ref 17–63)
ANION GAP: 5 (ref 5–15)
AST: 17 U/L (ref 15–41)
Albumin: 3.4 g/dL — ABNORMAL LOW (ref 3.5–5.0)
BILIRUBIN TOTAL: 0.2 mg/dL — AB (ref 0.3–1.2)
BUN: 21 mg/dL — ABNORMAL HIGH (ref 6–20)
CALCIUM: 8.8 mg/dL — AB (ref 8.9–10.3)
CO2: 25 mmol/L (ref 22–32)
Chloride: 109 mmol/L (ref 101–111)
Creatinine, Ser: 0.72 mg/dL (ref 0.61–1.24)
Glucose, Bld: 144 mg/dL — ABNORMAL HIGH (ref 65–99)
Potassium: 4 mmol/L (ref 3.5–5.1)
Sodium: 139 mmol/L (ref 135–145)
TOTAL PROTEIN: 7 g/dL (ref 6.5–8.1)

## 2016-02-19 LAB — TROPONIN I

## 2016-02-19 LAB — LIPASE, BLOOD: Lipase: 32 U/L (ref 11–51)

## 2016-02-19 MED ORDER — ONDANSETRON HCL 4 MG/2ML IJ SOLN
4.0000 mg | Freq: Once | INTRAMUSCULAR | Status: AC
  Administered 2016-02-19: 4 mg via INTRAVENOUS
  Filled 2016-02-19: qty 2

## 2016-02-19 MED ORDER — FAMOTIDINE IN NACL 20-0.9 MG/50ML-% IV SOLN
20.0000 mg | Freq: Once | INTRAVENOUS | Status: AC
  Administered 2016-02-19: 20 mg via INTRAVENOUS
  Filled 2016-02-19: qty 50

## 2016-02-19 MED ORDER — ONDANSETRON 4 MG PO TBDP: 4.0000 mg | ORAL_TABLET | Freq: Once | ORAL | Status: DC | PRN

## 2016-02-19 MED ORDER — PANTOPRAZOLE SODIUM 40 MG PO TBEC: 40.0000 mg | DELAYED_RELEASE_TABLET | Freq: Every day | ORAL | Status: DC

## 2016-02-19 MED ORDER — SODIUM CHLORIDE 0.9 % IV BOLUS (SEPSIS)
1000.0000 mL | Freq: Once | INTRAVENOUS | Status: AC
  Administered 2016-02-19: 1000 mL via INTRAVENOUS

## 2016-02-19 NOTE — ED Notes (Signed)
Pt seen in subwait by Dr Mayford KnifeWilliams and moved to treatment room 12; verbal report given to rebecca, rn

## 2016-02-19 NOTE — Progress Notes (Signed)
This patient has been accepted for admission to [facility] for management of acute upper GI bleed with hematemesis.  He is on plavix for a history of coronary stents.  He reports NSAID use.  He has evidence of acute blood loss anemia, but he is currently hemodynamically stable.  Hgb was 14 in Feb, 10.3 now.  He has received IV pepcid, oral protonix, and NS 1L bolus in the ED at [facility].  He has been accepted to INPATIENT STATUS, telemetry bed.  He will likely need GI consultation in the morning, as long as he remains hemodynamically stable.

## 2016-02-19 NOTE — ED Provider Notes (Signed)
[facility] Emergency Department Provider Note        Time seen: ----------------------------------------- 10:09 PM on 02/19/2016 -----------------------------------------    I have reviewed the triage vital signs and the nursing notes.   HISTORY  Chief Complaint Hematemesis and Abdominal Pain    HPI Joshua Horn is a 65 y.o. male who presents to ER for hematemesis. Patient has had ongoing abdominal issues and occasional vomiting for months. He has never had hematemesis before. Patient states he vomited 3-4 times day and noticed blood as well as coffee-ground looking material. He went to his doctor's house and was told to come here for evaluation. Patient states he took ibuprofen for years and it probably had upset his stomach. Currently is on Plavix for previous stent placement. He denies fevers chills or other complaints.   Past Medical History  Diagnosis Date  . Hyperlipidemia   . Coronary artery disease     s/p stent   . Depression   . ADHD (attention deficit hyperactivity disorder)     Patient Active Problem List   Diagnosis Date Noted  . Chest pain 11/22/2012  . CAD (coronary artery disease) 02/09/2012  . Hypertension 02/09/2012  . Hyperlipidemia 02/09/2012    Past Surgical History  Procedure Laterality Date  . Cardiac catheterization  11/29/2012  . Coronary angioplasty      Allergies Sulfa antibiotics  Social History Social History  Substance Use Topics  . Smoking status: Never Smoker   . Smokeless tobacco: None  . Alcohol Use: No    Review of Systems Constitutional: Negative for fever. Cardiovascular: Negative for chest pain. Respiratory: Negative for shortness of breath. Gastrointestinal: Positive for abdominal pain, hematemesis Genitourinary: Negative for dysuria. Musculoskeletal: Negative for back pain. Skin: Negative for rash. Neurological: Negative for headaches, focal weakness or numbness.  10-point ROS  otherwise negative.  ____________________________________________   PHYSICAL EXAM:  VITAL SIGNS: ED Triage Vitals  Enc Vitals Group     BP 02/19/16 2118 127/75 mmHg     Pulse Rate 02/19/16 2118 77     Resp 02/19/16 2118 18     Temp 02/19/16 2118 98.3 F (36.8 C)     Temp Source 02/19/16 2118 Oral     SpO2 02/19/16 2118 99 %     Weight 02/19/16 2118 170 lb (77.111 kg)     Height 02/19/16 2118 6' (1.829 m)     Head Cir --      Peak Flow --      Pain Score 02/19/16 2118 0     Pain Loc --      Pain Edu? --      Excl. in GC? --    Constitutional: Alert and oriented. Well appearing and in no distress. Eyes: Conjunctivae are normal. PERRL. Normal extraocular movements. ENT   Head: Normocephalic and atraumatic.   Nose: No congestion/rhinnorhea.   Mouth/Throat: Mucous membranes are moist.   Neck: No stridor. Cardiovascular: Normal rate, regular rhythm. No murmurs, rubs, or gallops. Respiratory: Normal respiratory effort without tachypnea nor retractions. Breath sounds are clear and equal bilaterally. No wheezes/rales/rhonchi. Gastrointestinal: Soft and nontender. Normal bowel sounds Musculoskeletal: Nontender with normal range of motion in all extremities. No lower extremity tenderness nor edema. Neurologic:  Normal speech and language. No gross focal neurologic deficits are appreciated.  Skin:  Skin is warm, dry and intact. No rash noted. Psychiatric: Mood and affect are normal. Speech and behavior are normal.  ____________________________________________  EKG: Interpreted by me. Sinus rhythm rate of  79 bpm, normal PR interval, normal QRS, normal QT interval. Normal axis.  ____________________________________________  ED COURSE:  Pertinent labs & imaging results that were available during my care of the patient were reviewed by me and considered in my medical decision making (see chart for details). Patient presents to ER with hematemesis. We will check basic  labs. If his labs are significantly change she will likely need transfer due to no GI coverage. ____________________________________________    LABS (pertinent positives/negatives)  Labs Reviewed  COMPREHENSIVE METABOLIC PANEL - Abnormal; Notable for the following:    Glucose, Bld 144 (*)    BUN 21 (*)    Calcium 8.8 (*)    Albumin 3.4 (*)    ALT 10 (*)    Total Bilirubin 0.2 (*)    All other components within normal limits  CBC - Abnormal; Notable for the following:    RBC 3.37 (*)    Hemoglobin 10.3 (*)    HCT 29.9 (*)    All other components within normal limits  LIPASE, BLOOD  URINALYSIS COMPLETEWITH MICROSCOPIC ([facility] ONLY)  TROPONIN I  ____________________________________________  FINAL ASSESSMENT AND PLAN  Hematemesis, anemia  Plan: Patient with labs as dictated above. Patient's blood count has dropped for grams per deciliter since it was checked previously. He was given oral Protonix and IV Pepcid. Currently he is being given a saline bolus as well. He will need observation and serial hemoglobin and hematocrit checks. He is stable for transfer at this time.   Emily Filbert, MD   Note: This dictation was prepared with Dragon dictation. Any transcriptional errors that result from this process are unintentional   Emily Filbert, MD 02/19/16 2211

## 2016-02-19 NOTE — ED Notes (Signed)
Pt reports ongoing abd issues including vomiting blood; denies pain today but says he's vomited 3-4 times today and noticed blood; pt went by Dr Maree Krabbeate's house today and was told to come over here for evaluation; Dr Arlana Pouchate took blood pressure sitting (121/78) and standing (116/76); pt says he's spent years taking Ibuprofen and "it's probably eaten a hole in my stomach"; pt ambulatory with steady gait; denies diarrhea

## 2016-02-20 ENCOUNTER — Observation Stay (HOSPITAL_COMMUNITY)
Admission: AD | Admit: 2016-02-20 | Discharge: 2016-02-22 | Disposition: A | Discharge status: Home / Self Care | Payer: BC Managed Care – PPO | Source: Other Acute Inpatient Hospital | Attending: Internal Medicine | Admitting: Internal Medicine

## 2016-02-20 ENCOUNTER — Encounter (HOSPITAL_COMMUNITY): Payer: Self-pay | Admitting: Family Medicine

## 2016-02-20 ENCOUNTER — Encounter (HOSPITAL_COMMUNITY)
Admission: AD | Disposition: A | Discharge status: Home / Self Care | Payer: Self-pay | Source: Other Acute Inpatient Hospital | Attending: Internal Medicine

## 2016-02-20 DIAGNOSIS — I2510 Atherosclerotic heart disease of native coronary artery without angina pectoris: Secondary | ICD-10-CM | POA: Diagnosis present

## 2016-02-20 DIAGNOSIS — K269 Duodenal ulcer, unspecified as acute or chronic, without hemorrhage or perforation: Secondary | ICD-10-CM | POA: Insufficient documentation

## 2016-02-20 DIAGNOSIS — E785 Hyperlipidemia, unspecified: Secondary | ICD-10-CM | POA: Diagnosis not present

## 2016-02-20 DIAGNOSIS — I10 Essential (primary) hypertension: Secondary | ICD-10-CM | POA: Diagnosis not present

## 2016-02-20 DIAGNOSIS — K319 Disease of stomach and duodenum, unspecified: Secondary | ICD-10-CM | POA: Diagnosis not present

## 2016-02-20 DIAGNOSIS — Z79899 Other long term (current) drug therapy: Secondary | ICD-10-CM | POA: Diagnosis not present

## 2016-02-20 DIAGNOSIS — Z955 Presence of coronary angioplasty implant and graft: Secondary | ICD-10-CM | POA: Diagnosis not present

## 2016-02-20 DIAGNOSIS — F909 Attention-deficit hyperactivity disorder, unspecified type: Secondary | ICD-10-CM | POA: Diagnosis not present

## 2016-02-20 DIAGNOSIS — I25119 Atherosclerotic heart disease of native coronary artery with unspecified angina pectoris: Secondary | ICD-10-CM | POA: Diagnosis not present

## 2016-02-20 DIAGNOSIS — D62 Acute posthemorrhagic anemia: Secondary | ICD-10-CM | POA: Diagnosis present

## 2016-02-20 DIAGNOSIS — K922 Gastrointestinal hemorrhage, unspecified: Secondary | ICD-10-CM | POA: Diagnosis present

## 2016-02-20 DIAGNOSIS — Z7902 Long term (current) use of antithrombotics/antiplatelets: Secondary | ICD-10-CM | POA: Diagnosis not present

## 2016-02-20 DIAGNOSIS — K2950 Unspecified chronic gastritis without bleeding: Secondary | ICD-10-CM | POA: Diagnosis not present

## 2016-02-20 DIAGNOSIS — F329 Major depressive disorder, single episode, unspecified: Secondary | ICD-10-CM | POA: Diagnosis not present

## 2016-02-20 DIAGNOSIS — Z882 Allergy status to sulfonamides status: Secondary | ICD-10-CM | POA: Insufficient documentation

## 2016-02-20 DIAGNOSIS — K920 Hematemesis: Secondary | ICD-10-CM | POA: Diagnosis present

## 2016-02-20 DIAGNOSIS — Z888 Allergy status to other drugs, medicaments and biological substances status: Secondary | ICD-10-CM | POA: Diagnosis not present

## 2016-02-20 HISTORY — PX: ESOPHAGOGASTRODUODENOSCOPY: SHX5428

## 2016-02-20 LAB — URINALYSIS COMPLETE WITH MICROSCOPIC (ARMC ONLY)
BILIRUBIN URINE: NEGATIVE
GLUCOSE, UA: NEGATIVE mg/dL
Hgb urine dipstick: NEGATIVE
Ketones, ur: NEGATIVE mg/dL
LEUKOCYTES UA: NEGATIVE
Nitrite: NEGATIVE
Protein, ur: NEGATIVE mg/dL
Specific Gravity, Urine: 1.024 (ref 1.005–1.030)
pH: 5 (ref 5.0–8.0)

## 2016-02-20 LAB — CBC
HEMATOCRIT: 26.9 % — AB (ref 39.0–52.0)
Hemoglobin: 8.6 g/dL — ABNORMAL LOW (ref 13.0–17.0)
MCH: 29.3 pg (ref 26.0–34.0)
MCHC: 32 g/dL (ref 30.0–36.0)
MCV: 91.5 fL (ref 78.0–100.0)
Platelets: 310 10*3/uL (ref 150–400)
RBC: 2.94 MIL/uL — ABNORMAL LOW (ref 4.22–5.81)
RDW: 13.6 % (ref 11.5–15.5)
WBC: 8 10*3/uL (ref 4.0–10.5)

## 2016-02-20 SURGERY — EGD (ESOPHAGOGASTRODUODENOSCOPY)
Anesthesia: Moderate Sedation

## 2016-02-20 MED ORDER — ACETAMINOPHEN 650 MG RE SUPP: 650.0000 mg | Freq: Four times a day (QID) | RECTAL | Status: DC | PRN

## 2016-02-20 MED ORDER — POTASSIUM CHLORIDE IN NACL 20-0.9 MEQ/L-% IV SOLN
INTRAVENOUS | Status: DC
  Administered 2016-02-20 – 2016-02-22 (×6): via INTRAVENOUS
  Filled 2016-02-20 (×10): qty 1000

## 2016-02-20 MED ORDER — MIDAZOLAM HCL 5 MG/ML IJ SOLN
INTRAMUSCULAR | Status: AC
  Filled 2016-02-20: qty 2

## 2016-02-20 MED ORDER — HYDROMORPHONE HCL 1 MG/ML IJ SOLN
INTRAMUSCULAR | Status: AC
  Filled 2016-02-20: qty 1

## 2016-02-20 MED ORDER — MIDAZOLAM HCL 10 MG/2ML IJ SOLN
INTRAMUSCULAR | Status: DC | PRN
  Administered 2016-02-20 (×2): 2 mg via INTRAVENOUS
  Administered 2016-02-20: 1 mg via INTRAVENOUS

## 2016-02-20 MED ORDER — ONDANSETRON HCL 4 MG/2ML IJ SOLN
4.0000 mg | Freq: Once | INTRAMUSCULAR | Status: AC
  Administered 2016-02-20: 4 mg via INTRAVENOUS

## 2016-02-20 MED ORDER — FENTANYL CITRATE (PF) 100 MCG/2ML IJ SOLN
INTRAMUSCULAR | Status: DC | PRN
  Administered 2016-02-20: 25 ug via INTRAVENOUS

## 2016-02-20 MED ORDER — DIPHENHYDRAMINE HCL 50 MG/ML IJ SOLN
INTRAMUSCULAR | Status: AC
  Filled 2016-02-20: qty 1

## 2016-02-20 MED ORDER — SODIUM CHLORIDE 0.9 % IV SOLN
INTRAVENOUS | Status: DC
  Administered 2016-02-20: 11:00:00 via INTRAVENOUS

## 2016-02-20 MED ORDER — BUTAMBEN-TETRACAINE-BENZOCAINE 2-2-14 % EX AERO
INHALATION_SPRAY | CUTANEOUS | Status: DC | PRN
Start: 2016-02-20 — End: 2016-02-20
  Administered 2016-02-20: 2 via TOPICAL

## 2016-02-20 MED ORDER — ACETAMINOPHEN 325 MG PO TABS
650.0000 mg | ORAL_TABLET | Freq: Four times a day (QID) | ORAL | Status: DC | PRN
Start: 2016-02-20 — End: 2016-02-22
  Administered 2016-02-21 (×2): 650 mg via ORAL
  Filled 2016-02-20 (×2): qty 2

## 2016-02-20 MED ORDER — METHYLPHENIDATE HCL ER 18 MG PO TB24
54.0000 mg | ORAL_TABLET | Freq: Every day | ORAL | Status: DC
  Administered 2016-02-21 – 2016-02-22 (×2): 54 mg via ORAL
  Filled 2016-02-20 (×2): qty 3

## 2016-02-20 MED ORDER — INSULIN ASPART 100 UNIT/ML ~~LOC~~ SOLN
SUBCUTANEOUS | Status: AC
  Filled 2016-02-20: qty 6

## 2016-02-20 MED ORDER — METOPROLOL SUCCINATE ER 25 MG PO TB24
25.0000 mg | ORAL_TABLET | Freq: Every day | ORAL | Status: DC
  Administered 2016-02-20 – 2016-02-22 (×3): 25 mg via ORAL
  Filled 2016-02-20 (×3): qty 1

## 2016-02-20 MED ORDER — ROSUVASTATIN CALCIUM 20 MG PO TABS
20.0000 mg | ORAL_TABLET | Freq: Every day | ORAL | Status: DC
  Administered 2016-02-20 – 2016-02-21 (×2): 20 mg via ORAL
  Filled 2016-02-20 (×2): qty 1

## 2016-02-20 MED ORDER — PANTOPRAZOLE SODIUM 40 MG PO TBEC
40.0000 mg | DELAYED_RELEASE_TABLET | Freq: Two times a day (BID) | ORAL | Status: DC
  Administered 2016-02-20 – 2016-02-22 (×5): 40 mg via ORAL
  Filled 2016-02-20 (×5): qty 1

## 2016-02-20 MED ORDER — FENTANYL CITRATE (PF) 100 MCG/2ML IJ SOLN
INTRAMUSCULAR | Status: AC
  Filled 2016-02-20: qty 2

## 2016-02-20 MED ORDER — SUCRALFATE 1 G PO TABS
1.0000 g | ORAL_TABLET | Freq: Four times a day (QID) | ORAL | Status: DC
  Administered 2016-02-20 – 2016-02-22 (×8): 1 g via ORAL
  Filled 2016-02-20 (×8): qty 1

## 2016-02-20 MED ORDER — ONDANSETRON HCL 4 MG/2ML IJ SOLN
INTRAMUSCULAR | Status: AC
  Filled 2016-02-20: qty 2

## 2016-02-20 MED ORDER — PROMETHAZINE HCL 25 MG/ML IJ SOLN
INTRAMUSCULAR | Status: AC
  Filled 2016-02-20: qty 1

## 2016-02-20 MED ORDER — ONDANSETRON HCL 4 MG/2ML IJ SOLN
4.0000 mg | Freq: Four times a day (QID) | INTRAMUSCULAR | Status: DC | PRN
  Administered 2016-02-21: 4 mg via INTRAVENOUS
  Filled 2016-02-20: qty 2

## 2016-02-20 MED ORDER — PROMETHAZINE HCL 25 MG/ML IJ SOLN
12.5000 mg | Freq: Once | INTRAMUSCULAR | Status: AC
  Administered 2016-02-20: 12.5 mg via INTRAVENOUS

## 2016-02-20 MED ORDER — FAMOTIDINE IN NACL 20-0.9 MG/50ML-% IV SOLN
20.0000 mg | Freq: Two times a day (BID) | INTRAVENOUS | Status: DC
  Administered 2016-02-20 – 2016-02-22 (×5): 20 mg via INTRAVENOUS
  Filled 2016-02-20 (×6): qty 50

## 2016-02-20 MED ORDER — EZETIMIBE 10 MG PO TABS
10.0000 mg | ORAL_TABLET | Freq: Every day | ORAL | Status: DC
  Administered 2016-02-21 – 2016-02-22 (×2): 10 mg via ORAL
  Filled 2016-02-20 (×2): qty 1

## 2016-02-20 MED ORDER — ONDANSETRON HCL 4 MG PO TABS
4.0000 mg | ORAL_TABLET | Freq: Four times a day (QID) | ORAL | Status: DC | PRN
  Administered 2016-02-22: 4 mg via ORAL
  Filled 2016-02-20: qty 1

## 2016-02-20 NOTE — Progress Notes (Signed)
Pt admitted after midnight, please refer to admission note done 02/20/16.Pt admitted for coffee ground emesis. GI plans for EGD today. Avoid NSAID's, plavix  Supportive care with IV fluids Continue protonix   Manson Passeylma Seerat Peaden [facility] [phone]

## 2016-02-20 NOTE — H&P (Signed)
History and Physical  Patient Name: Joshua Horn     AVW:[id number]    DOB: 02/10/1951    DOA: 02/20/2016 PCP: Jaclyn Shaggy, MD   Patient coming from: Home  Chief Complaint: Hematemesis  HPI: Joshua Horn is a 65 y.o. male with a past medical history significant for CAD s/p PCI in 2009 on Plavix, ADD who presents with hematemesis.  Patient reports ~6 months of dyspepsia, vomiting 1-2 times per week.  Dyspepsia is worse between meals.  He started cimetidine a few weeks ago with mild improvement, then saw his PCP recently and was started on Dexilant.  Over the last week, he has noticed "tarry green" stools most times.  Then tonight he vomited a lot of bright red blood, followed by a few emesis with coffee grounds, so he came to the ER.  He takes Plavix.  He has had headaches for a long time, and "for years" has taken 20-25 tablets of ibuprofen 200 mg daily, up until 2 days ago.  15 lbs weight loss this spring.  ED course: -Afebrile, hemodynamically stable, SpO2 normal on room air -Na 139, K 4.0, BUN elevated, Cr 0.7, WBC 8.8K, Hgb 10.3 (14 in February) -He was given famotidine and pantoprazole and [facility] were asked to accept in transfer because of lack of GI coverage at [facility] -He had one further emesis at midnight without coffee grounds and HR remained WNL all night  No previous GI bleed.  No previous colonscopy.     ROS: Pt complains of hematemesis, melena, dyspepsia, 15 lbs unintentional recent weight loss.  Pt denies any dizziness, weakness, hematochezia, syncope, collapse, chest pain, exertional chest discomfort.    All other systems negative except as just noted or noted in the history of present illness.    Past Medical History  Diagnosis Date  . Hyperlipidemia   . Coronary artery disease     s/p stent   . Depression   . ADHD (attention deficit hyperactivity disorder)     Past Surgical History  Procedure Laterality Date  . Cardiac catheterization  11/29/2012    . Coronary angioplasty      Social History: Patient lives with his wife.  The patient walks unassisted. He is not a smoker. He does not drink alcohol. He is a Education officer, community in [place].    Allergies  Allergen Reactions  . Sulfa Antibiotics     rash  . Bupropion Rash    Family history: family history includes Heart attack (age of onset: 57) in his father; Stroke in his mother.  Prior to Admission medications   Medication Sig Start Date End Date Taking? Authorizing Provider  cimetidine (TAGAMET) 200 MG tablet Take 200 mg by mouth 2 (two) times daily.    Historical Provider, MD  clopidogrel (PLAVIX) 75 MG tablet Take 1 tablet (75 mg total) by mouth daily. 06/11/15   Antonieta Iba, MD  dexlansoprazole (DEXILANT) 60 MG capsule Take 60 mg by mouth daily.    Historical Provider, MD  ezetimibe (ZETIA) 10 MG tablet Take 1 tablet (10 mg total) by mouth daily. 06/11/15   Antonieta Iba, MD  ibuprofen (ADVIL,MOTRIN) 200 MG tablet Take 200 mg by mouth every 6 (six) hours as needed.    Historical Provider, MD  methylphenidate (CONCERTA) 54 MG CR tablet Take 54 mg by mouth every morning.    Historical Provider, MD  metoprolol succinate (TOPROL-XL) 25 MG 24 hr tablet Take 1 tablet (25 mg total) by mouth daily. 06/11/15  Antonieta Ibaimothy J Gollan, MD  rosuvastatin (CRESTOR) 20 MG tablet Take 1 tablet (20 mg total) by mouth at bedtime. 02/09/12   Antonieta Ibaimothy J Gollan, MD       Physical Exam: BP 139/67 mmHg  Pulse 76  Temp(Src) 98.2 F (36.8 C) (Oral)  Resp 16  Ht 6' (1.829 m)  Wt 75.3 kg (166 lb 0.1 oz)  BMI 22.51 kg/m2  SpO2 100% General appearance: Thin older male, alert and in no acute distress .   Eyes: Conjunctiva normal, lids and lashes normal.   PERRL.  ENT: No nasal deformity, discharge.  OP moist without lesions.   Lymph: No cervical or supraclavicular lymphadenopathy. Skin: Warm and dry.  No suspicious rashes or lesions. Cardiac: RRR, nl S1-S2, no murmurs appreciated.  Capillary refill is  brisk.  JVP normal.  No LE edema.  Radial and DP pulses 2+ and symmetric. Respiratory: Normal respiratory rate and rhythm.  CTAB without rales or wheezes. GI: Abdomen soft without rigidity.  No TTP. No ascites, distension, hepatosplenomegaly.   MSK: No deformities or effusions.  No clubbing/cyanosis. Neuro: Cranial nerves normal.  Some inadvertent twitching.  Sensorium intact and responding to questions, attention normal.  Speech is fluent.  Moves all extremities equally and with normal coordination.    Psych: Affect normal.  Judgment and insight appear normal.       Labs on Admission:  I have personally reviewed following labs and imaging studies: CBC:  Recent Labs Lab 02/19/16 2142  WBC 8.8  HGB 10.3*  HCT 29.9*  MCV 88.7  PLT 333   Basic Metabolic Panel:  Recent Labs Lab 02/19/16 2142  NA 139  K 4.0  CL 109  CO2 25  GLUCOSE 144*  BUN 21*  CREATININE 0.72  CALCIUM 8.8*   GFR: Estimated Creatinine Clearance: 99.4 mL/min (by C-G formula based on Cr of 0.72).  Liver Function Tests:  Recent Labs Lab 02/19/16 2142  AST 17  ALT 10*  ALKPHOS 97  BILITOT 0.2*  PROT 7.0  ALBUMIN 3.4*    Recent Labs Lab 02/19/16 2142  LIPASE 32   Cardiac Enzymes:  Recent Labs Lab 02/19/16 2142  TROPONINI <0.03        EKG: Independently reviewed. Rate79, QTC 428,sinus rhythm, no T-wave or ST changes. No change from previous.    Assessment/Plan 1. Upper GI bleed:  +NSAID use, excessive.   -Famotidine IV twice a day -Pantoprazole by mouth twice a day -Consult to GI, appreciate cares -Hold NSAIDs -NPO and MIVF    2. Acute blood loss anemia:  -Repeat CBC now  3. Coronary artery disease and HTN:  -hold Plavix -continue metoprolol, simvastatin, insomnia  4. ADD:  -Continue methylphenidate      DVT prophylaxis: SCDs  Code Status: FULL  Family Communication: Wife at bedside  Disposition Plan: Anticipate observation in hospital, consultation with GI.   Trend hemoglobin.  Further disposition pending ongoing blood loss and specialist consultant. Consults called: None overnight Admission status: OBS, med surg   Medical decision making: Patient seen at 6:17 AM on 02/20/2016. What exists of the patient's chart was reviewed in depth.  Clinical condition: stable.        Alberteen SamChristopher P Rupal Childress [facility] Pager [phone]

## 2016-02-20 NOTE — Consult Note (Signed)
Referring Provider:   Dr. 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 Prior to that, he had had some dark stools but not necessarily melena. He did not feel dizzy or syncopal at the time of this episode.  Of note, the patient takes about 20 ibuprofen's per day because of chronic headaches, and is also on Plavix because of a remote coronary stent placement.   He reports a 6 month history of upper abdominal dyspeptic discomfort but has no prior history of ulcer disease or GI bleeding. He has been using some H2 blockers and was just started on Dexilant the day prior to admission.  Especially in the past couple of weeks, he has had food intolerance, food avoidance, and he thinks he has lost about 15 pounds without trying.   Last night, his hemoglobin was 10.3, as compared to a reported baseline of 14. BUN was minimally elevated at 21.   Overnight in the hospital, there has been no further bleeding. He has remained hemodynamically stable.  The patient states he has never had a colonoscopy, but is open to considering having one.   Past Medical History  Diagnosis Date  . Hyperlipidemia   . Coronary artery disease     s/p stent   . Depression   . ADHD (attention deficit hyperactivity disorder)     Past Surgical History  Procedure Laterality Date  . Cardiac catheterization  11/29/2012  . Coronary angioplasty      Prior to Admission medications   Medication Sig Start Date End Date Taking? Authorizing Provider  cimetidine (TAGAMET) 200 MG tablet Take 200 mg by mouth 2 (two) times daily.    Historical Provider, MD  clopidogrel (PLAVIX) 75 MG tablet Take 1 tablet (75 mg total) by mouth daily.  06/11/15   Antonieta Iba, MD  dexlansoprazole (DEXILANT) 60 MG capsule Take 60 mg by mouth daily.    Historical Provider, MD  ezetimibe (ZETIA) 10 MG tablet Take 1 tablet (10 mg total) by mouth daily. 06/11/15   Antonieta Iba, MD  ibuprofen (ADVIL,MOTRIN) 200 MG tablet Take 200 mg by mouth every 6 (six) hours as needed.    Historical Provider, MD  methylphenidate (CONCERTA) 54 MG CR tablet Take 54 mg by mouth every morning.    Historical Provider, MD  metoprolol succinate (TOPROL-XL) 25 MG 24 hr tablet Take 1 tablet (25 mg total) by mouth daily. 06/11/15   Antonieta Iba, MD  rosuvastatin (CRESTOR) 20 MG tablet Take 1 tablet (20 mg total) by mouth at bedtime. 02/09/12   Antonieta Iba, MD    Current Facility-Administered Medications  Medication Dose Route Frequency Provider Last Rate Last Dose  . 0.9 % NaCl with KCl 20 mEq/ L  infusion   Intravenous Continuous Alberteen Sam, MD 125 mL/hr at 02/20/16 0757    . acetaminophen (TYLENOL) tablet 650 mg  650 mg Oral Q6H PRN Alberteen Sam, MD       Or  . acetaminophen (TYLENOL) suppository 650 mg  650 mg Rectal Q6H PRN Alberteen Sam, MD      . ezetimibe (ZETIA) tablet 10 mg  10 mg Oral Daily Alberteen Sam, MD   10 mg at 02/20/16 0810  . famotidine (  PEPCID) IVPB 20 mg premix  20 mg Intravenous Q12H Alberteen Sam, MD   20 mg at 02/20/16 0758  . methylphenidate (CONCERTA) CR tablet 54 mg  54 mg Oral Daily Alberteen Sam, MD   54 mg at 02/20/16 0811  . metoprolol succinate (TOPROL-XL) 24 hr tablet 25 mg  25 mg Oral Daily Alberteen Sam, MD   25 mg at 02/20/16 0807  . ondansetron (ZOFRAN) tablet 4 mg  4 mg Oral Q6H PRN Alberteen Sam, MD       Or  . ondansetron (ZOFRAN) injection 4 mg  4 mg Intravenous Q6H PRN Alberteen Sam, MD      . pantoprazole (PROTONIX) EC tablet 40 mg  40 mg Oral BID Alberteen Sam, MD   40 mg at 02/20/16 0806  . rosuvastatin (CRESTOR) tablet 20 mg   20 mg Oral QHS Alberteen Sam, MD        Allergies as of 02/19/2016 - Review Complete 02/19/2016  Allergen Reaction Noted  . Sulfa antibiotics  02/09/2012  . Bupropion Rash 02/19/2016    Family History  Problem Relation Age of Onset  . Heart attack Father 44  . Stroke Mother     Social History   Social History  . Marital Status: Married    Spouse Name: N/A  . Number of Children: N/A  . Years of Education: N/A   Occupational History  . Not on file.   Social History Main Topics  . Smoking status: Never Smoker   . Smokeless tobacco: Not on file  . Alcohol Use: No  . Drug Use: No  . Sexual Activity: Not on file   Other Topics Concern  . Not on file   Social History Narrative    Review of Systems: Anorexia and weight loss as per history of present illness, recent dark stools, no chest pain, no shortness of breath, no focal neurologic symptoms but chronic headaches as per history of present illness, slight urinary hesitancy but no other urinary symptoms, no lymph node enlargement, no skin rashes, no joint effusions.  Physical Exam: Vital signs in last 24 hours: Temp:  [98.2 F (36.8 C)-98.8 F (37.1 C)] 98.2 F (36.8 C) (07/09 0600) Pulse Rate:  [70-77] 76 (07/09 0600) Resp:  [14-18] 16 (07/09 0600) BP: (118-139)/(57-103) 139/67 mmHg (07/09 0600) SpO2:  [95 %-100 %] 100 % (07/09 0600) Weight:  [75.3 kg (166 lb 0.1 oz)-77.111 kg (170 lb)] 75.3 kg (166 lb 0.1 oz) (07/09 0600) Last BM Date: 02/19/16 General:   Alert,  Well-developed, well-nourished, pleasant and cooperative in NAD Head:  Normocephalic and atraumatic. Eyes:  Sclera clear, no icterus.   Conjunctiva pink. Mouth:   No ulcerations or lesions.  Oropharynx pink & moist. Neck:   No masses or thyromegaly. Lungs:  Clear throughout to auscultation.   No wheezes, crackles, or rhonchi. No evident respiratory distress. Heart:   Regular rate and rhythm; no murmurs, clicks, rubs,  or gallops. Abdomen:  No  organomegaly, guarding, mass effect, or tenderness. No distention. Msk:   Symmetrical without gross deformities. Pulses:  Normal radial pulse is noted. Extremities:   Without clubbing, cyanosis, or edema. Neurologic:  Alert and coherent;  grossly normal neurologically. Skin:  Intact without significant lesions or rashes. Cervical Nodes:  No significant cervical adenopathy. Psych:   Alert and cooperative. Normal mood and affect.  Intake/Output from previous day:   Intake/Output this shift: Total I/O In: 50 [IV Piggyback:50] Out: -   Lab  Results:  Recent Labs  02/19/16 2142  WBC 8.8  HGB 10.3*  HCT 29.9*  PLT 333   BMET  Recent Labs  02/19/16 2142  NA 139  K 4.0  CL 109  CO2 25  GLUCOSE 144*  BUN 21*  CREATININE 0.72  CALCIUM 8.8*   LFT  Recent Labs  02/19/16 2142  PROT 7.0  ALBUMIN 3.4*  AST 17  ALT 10*  ALKPHOS 97  BILITOT 0.2*   PT/INR No results for input(s): LABPROT, INR in the last 72 hours.  Studies/Results: No results found.  Impression: 1. Upper GI bleeding, characterized by transient hematemesis; suspect NSAID induced gastric ulceration based on patient's history. However, the prodromal long-term symptoms, and the food intolerance and weight loss, bring to mind at least the possibility of a neoplastic process. 2. Posthemorrhagic anemia, acute 3. Overdue for colon cancer screening  Plan: 1. Agree with PPI therapy 2. Endoscopic evaluation later today. Nature, purpose, and risks (including cardiopulmonary reactions from sedation, such as aspiration or respiratory arrest, perforation, and bleeding) discussed and patient agreeable. 3. Further management to depend on the endoscopic findings 4. The patient will need outpatient follow-up   to arrange colonoscopy.   LOS: 0 days   Dayzee Trower V  02/20/2016, 10:26 AM   Pager [phone] If no answer or after 5 PM call [phone]

## 2016-02-20 NOTE — Op Note (Signed)
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 No                            prior h/o GI bleeding. Providers:                Bernette Redbirdobert Kaheem Halleck, MD, Waynard EdwardsMegan Oliver RN, RN, Roselie AwkwardShannon                            Love, RN, Arlee Muslimhris Chandler, Technician Referring MD:              Medicines:                Fentanyl 25 micrograms IV, Midazolam 5 mg IV Complications:            No immediate complications. Estimated Blood Loss:     Estimated blood loss was minimal. Procedure:                Pre-Anesthesia Assessment:                           - Prior to the procedure, a History and Physical                            was performed, and patient medications and                            allergies were reviewed. The patient's tolerance of                            previous anesthesia was also reviewed. The risks                            and benefits of the procedure and the sedation                            options and risks were discussed with the patient.                            All questions were answered, and informed consent                            was obtained. Prior Anticoagulants: The patient has                            taken Plavix (clopidogrel), last dose was 1 day  prior to procedure. ASA Grade Assessment: III - A                            patient with severe systemic disease. After                            reviewing the risks and benefits, the patient was                            deemed in satisfactory condition to undergo the                            procedure.                           After obtaining informed  consent, the endoscope was                            passed under direct vision. Throughout the                            procedure, the patient's blood pressure, pulse, and                            oxygen saturations were monitored continuously. The                            EG-[id number] ([id number]) scope was introduced through the                            mouth, and advanced to the second part of duodenum.                            The upper GI endoscopy was accomplished without                            difficulty. The patient tolerated the procedure                            well. Scope In: Scope Out: Findings:      The examined esophagus was normal.      A large amount of food (residue) was found in the gastric fundus and on       the greater curvature of the stomach, obscuring visualization of that       portion of the stomach. No blood or clots or coffee grounds were       observed in the gastric lumen.      Multiple diffuse, erosions were found in the gastric fundus and in the       gastric body, with some mucosal hemorrhage but no frank bleeding.       Interestingly, the antrum (which is most commonly affected by       nonsteroidal inflammation) appeared essentially normal.      The cardia and gastric fundus were normal on retroflexion.      no gastric masses were seen.  One very large, semicircumferential, non-bleeding cratered duodenal       ulcer with no stigmata of bleeding but a slightly dirty base,was found       in the proximalsecond portion of the duodenum. The lesion was 30 mm in       largest dimension. Biopsies were taken with a cold forceps for histology       from the antrum of the stomach to look for evidence of Helicobacter       pylori infection. Impression:               - Normal esophagus.                           - A large amount of food (residue) in the stomach.                            It is felt that the large duodenal ulcer, while not                             causing any anatomic stricture or obstruction, is                            nonetheless probably inhibiting gastric emptying                            because of dysmotility.                           - Hemorrhagic erosive gastropathy, most likely a                            consequence of his extensive use of ibuprofen.                           - Large non-bleeding duodenal ulcer with no                            stigmata of bleeding. Biopsied. Moderate Sedation:      Moderate (conscious) sedation was administered by the endoscopy nurse       and supervised by the endoscopist. The following parameters were       monitored: oxygen saturation, heart rate, blood pressure, and response       to care. Total physician intraservice time was 11 minutes. Recommendation:           - Await pathology results.                           - Give Protonix (pantoprazole): initiate therapy                            with 80 mg IV bolus, then 8 mg/hr IV by continuous                            infusion for 2 days.                           -  Use sucralfate tablets 1 gram PO QID for 1 month.                           - Clear liquid diet for 1 day.                           - Continue present medications. Procedure Code(s):        --- Professional ---                           [phone], Esophagogastroduodenoscopy, flexible,                            transoral; with biopsy, single or multiple Diagnosis Code(s):        --- Professional ---                           K92.2, Gastrointestinal hemorrhage, unspecified                           K26.9, Duodenal ulcer, unspecified as acute or                            chronic, without hemorrhage or perforation                           K92.0, Hematemesis CPT copyright 2016 [employer]. All rights reserved. The codes documented in this report are preliminary and upon coder review may  be revised to meet current compliance  requirements. Bernette Redbird, MD 02/20/2016 11:52:20 AM This report has been signed electronically. Number of Addenda: 0

## 2016-02-20 NOTE — Progress Notes (Signed)
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 No bleeding, no blood present, patient's lesions do not appear to be at high risk for rebleeding. Please see procedure report for details.  Recommended IV Protonix infusion, but it is on national shortage, so we will continue oral PPI therapy and add sucralfate.  I would recommend a clear liquid diet for the next day, until hopefully some of the swelling and dysmotility improve, at which time the patient could be advanced to a low residue diet, which he should remain on for a long time until normal motility is clearly established.  Of course, he should remain off ibuprofen until the ulcer heals, and thereafter, should use it only sparingly and with ongoing PPI prophylaxis.  I will contact the patient with his biopsy results (checking for Helicobacter pylori infection) and to arrange office follow-up with me in a month or so, to monitor his symptoms and to arrange follow-up endoscopy to confirm healing, and screening colonoscopy (which he has never had).  In the meantime, my partners will continue to round on the patient. I think that the criterion for discharge will be reasonable food tolerance.  Florencia Reasonsobert V. Virgen Belland, M.D. Pager [phone] If no answer or after 5 PM call [phone]

## 2016-02-21 ENCOUNTER — Encounter (HOSPITAL_COMMUNITY): Payer: Self-pay | Admitting: Gastroenterology

## 2016-02-21 DIAGNOSIS — D62 Acute posthemorrhagic anemia: Secondary | ICD-10-CM | POA: Diagnosis not present

## 2016-02-21 DIAGNOSIS — I25110 Atherosclerotic heart disease of native coronary artery with unstable angina pectoris: Secondary | ICD-10-CM | POA: Diagnosis not present

## 2016-02-21 DIAGNOSIS — I10 Essential (primary) hypertension: Secondary | ICD-10-CM | POA: Diagnosis not present

## 2016-02-21 DIAGNOSIS — K922 Gastrointestinal hemorrhage, unspecified: Secondary | ICD-10-CM | POA: Diagnosis not present

## 2016-02-21 DIAGNOSIS — K920 Hematemesis: Secondary | ICD-10-CM | POA: Diagnosis not present

## 2016-02-21 LAB — BASIC METABOLIC PANEL
Anion gap: 3 — ABNORMAL LOW (ref 5–15)
BUN: 10 mg/dL (ref 6–20)
CO2: 26 mmol/L (ref 22–32)
Calcium: 8.2 mg/dL — ABNORMAL LOW (ref 8.9–10.3)
Chloride: 107 mmol/L (ref 101–111)
Creatinine, Ser: 0.75 mg/dL (ref 0.61–1.24)
GFR calc Af Amer: >60 mL/min (ref >60)
GFR calc non Af Amer: >60 mL/min (ref >60)
GLUCOSE: 101 mg/dL — AB (ref 65–99)
POTASSIUM: 4.3 mmol/L (ref 3.5–5.1)
SODIUM: 136 mmol/L (ref 135–145)

## 2016-02-21 LAB — CBC
HEMATOCRIT: 25.4 % — AB (ref 39.0–52.0)
HEMOGLOBIN: 8.3 g/dL — AB (ref 13.0–17.0)
MCH: 30.1 pg (ref 26.0–34.0)
MCHC: 32.7 g/dL (ref 30.0–36.0)
MCV: 92 fL (ref 78.0–100.0)
Platelets: 290 10*3/uL (ref 150–400)
RBC: 2.76 MIL/uL — ABNORMAL LOW (ref 4.22–5.81)
RDW: 13.7 % (ref 11.5–15.5)
WBC: 8.1 10*3/uL (ref 4.0–10.5)

## 2016-02-21 NOTE — Progress Notes (Signed)
[facility] PROGRESS NOTE Subjective Pt feeling OK some early satiety  Objective: Vital signs in last 24 hours: Temp:  [97.5 F (36.4 C)-98.6 F (37 C)] 98 F (36.7 C) (07/10 0440) Pulse Rate:  [67-78] 78 (07/10 0440) Resp:  [12-21] 16 (07/10 0440) BP: (120-158)/(57-99) 124/61 mmHg (07/10 0440) SpO2:  [96 %-100 %] 97 % (07/10 0440) Weight:  [75.297 kg (166 lb)-75.31 kg (166 lb 0.5 oz)] 75.31 kg (166 lb 0.5 oz) (07/09 2044) Last BM Date: 02/20/16  Intake/Output from previous day: 07/09 0701 - 07/10 0700 In: 3420.8 [P.O.:900; I.V.:2420.8; IV Piggyback:100] Out: 2200 [Urine:2200] Intake/Output this shift:    PE:  Abdomen--nondistended, soft   Lab Results:  Recent Labs  02/19/16 2142 02/20/16 1000 02/21/16 0502  WBC 8.8 8.0 8.1  HGB 10.3* 8.6* 8.3*  HCT 29.9* 26.9* 25.4*  PLT 333 310 290   BMET  Recent Labs  02/19/16 2142 02/21/16 0502  NA 139 136  K 4.0 4.3  CL 109 107  CO2 25 26  CREATININE 0.72 0.75   LFT  Recent Labs  02/19/16 2142  PROT 7.0  AST 17  ALT 10*  ALKPHOS 97  BILITOT 0.2*   PT/INR No results for input(s): LABPROT, INR in the last 72 hours. PANCREAS  Recent Labs  02/19/16 2142  LIPASE 32         Studies/Results: No results found.  Medications: I have reviewed the patient's current medications.  Assessment/Plan: 1. DU with partal GOO. Seems to be doing ok no gross bleeding but Hg dropped a little. Would advance to fulls and check Hg in AM if stable could go home on low residue diet, discussed with pt, and follow up with Dr Matthias HughsBuccini in 3-4 weeks to arrange repeat EGD and probable colonoscopy.   Jaevion Goto JR,Tomi Grandpre L 02/21/2016, 9:08 AM  This note was created using voice recognition software. Minor errors may Have occurred unintentionally.  Pager: [phone] If no answer or after hours call [phone]

## 2016-02-21 NOTE — Progress Notes (Signed)
Patient ID: Joshua Horn, male   DOB: [birth year], 65 y.o.   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 Prior to that, he had had some dark stools but not melena. No dizziness or syncope at the time of this episode. The patient takes about 20 ibuprofen's per day because of chronic headaches and is also on Plavix because of a remote coronary stent placement.  Pt reported a 6 month history of upper abdominal dyspeptic discomfort but has no prior history of ulcer disease or GI bleeding. He has been using H2 blockers and was just started on Dexilant the day prior to admission.  He presented with hemoglobin of 10.3 (baseline 14). GI has seen him in consultation. 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 No bleeding, no blood present, patient's lesions do not appear to be at high risk for rebleeding.    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 No bleeding, no blood present, patient's lesions do not appear to be at high risk for rebleeding.  - Ideally he should be on PPI drip but due to national shortage of this he is on PO PPI and sucralfate - Continue pepcid 20 mg IV Q 12 hours  - GI recommends full liquid diet today - Check Hgb in am  - Hgb this am is 8.3 - Continue supportive care with IV fluids   Active Problems:   CAD (coronary artery  disease) - Plavix on hold due to risk of bleeding     Essential hypertension - Continue metoprolol    Dyslipidemia - Continue statin therapy    DVT prophylaxis: SCD's bilaterally due to risk of bleeding  Code Status: full code  Family Communication: no family at the bedside this am  Disposition Plan: home in am if hgb stable    Consultants:   GI, Dr. 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 No bleeding, no blood present, patient's lesions do not appear to be at high risk for rebleeding.   Antimicrobials:   None    Subjective: No overnight events.   Objective: Filed Vitals:   02/20/16 1719 02/20/16 2044 02/21/16 0440 02/21/16 0900  BP: 123/71 122/62 124/61 125/66  Pulse: 75 70 78 74  Temp: 98.5 F (36.9 C) 98.2 F (36.8 C) 98 F (36.7 C) 98.8 F (37.1 C)  TempSrc: Oral Oral Oral Oral  Resp: 18 18 16 16   Height:      Weight:  75.31 kg (166 lb 0.5 oz)    SpO2: 98% 97% 97% 99%    Intake/Output Summary (Last 24 hours) at 02/21/16 1043 Last data filed at 02/21/16 0700  Gross per 24 hour  Intake 3039.58 ml  Output   2200 ml  Net 839.58 ml   Filed Weights   02/20/16 0600  02/20/16 1047 02/20/16 2044  Weight: 75.3 kg (166 lb 0.1 oz) 75.297 kg (166 lb) 75.31 kg (166 lb 0.5 oz)    Examination:  General exam: Appears calm and comfortable  Respiratory system: Clear to auscultation. Respiratory effort normal. Cardiovascular system: S1 & S2 heard, RRR. No JVD, murmurs, rubs, gallops or clicks. No pedal edema. Gastrointestinal system: Abdomen is nondistended, soft and nontender. No organomegaly or masses felt. Normal bowel sounds heard. Central nervous system: Alert and oriented. No focal neurological deficits. Extremities: Symmetric 5 x 5 power. Skin: No rashes, lesions or ulcers Psychiatry: Judgement and insight appear normal. Mood & affect  appropriate.   Data Reviewed: I have personally reviewed following labs and imaging studies  CBC:  Recent Labs Lab 02/19/16 2142 02/20/16 1000 02/21/16 0502  WBC 8.8 8.0 8.1  HGB 10.3* 8.6* 8.3*  HCT 29.9* 26.9* 25.4*  MCV 88.7 91.5 92.0  PLT 333 310 290   Basic Metabolic Panel:  Recent Labs Lab 02/19/16 2142 02/21/16 0502  NA 139 136  K 4.0 4.3  CL 109 107  CO2 25 26  GLUCOSE 144* 101*  BUN 21* 10  CREATININE 0.72 0.75  CALCIUM 8.8* 8.2*   GFR: Estimated Creatinine Clearance: 99.4 mL/min (by C-G formula based on Cr of 0.75). Liver Function Tests:  Recent Labs Lab 02/19/16 2142  AST 17  ALT 10*  ALKPHOS 97  BILITOT 0.2*  PROT 7.0  ALBUMIN 3.4*    Recent Labs Lab 02/19/16 2142  LIPASE 32   No results for input(s): AMMONIA in the last 168 hours. Coagulation Profile: No results for input(s): INR, PROTIME in the last 168 hours. Cardiac Enzymes:  Recent Labs Lab 02/19/16 2142  TROPONINI <0.03   BNP (last 3 results) No results for input(s): PROBNP in the last 8760 hours. HbA1C: No results for input(s): HGBA1C in the last 72 hours. CBG: No results for input(s): GLUCAP in the last 168 hours. Lipid Profile: No results for input(s): CHOL, HDL, LDLCALC, TRIG, CHOLHDL, LDLDIRECT in the last 72 hours. Thyroid Function Tests: No results for input(s): TSH, T4TOTAL, FREET4, T3FREE, THYROIDAB in the last 72 hours. Anemia Panel: No results for input(s): VITAMINB12, FOLATE, FERRITIN, TIBC, IRON, RETICCTPCT in the last 72 hours. Urine analysis:    Component Value Date/Time   COLORURINE YELLOW* 02/20/2016 0005   APPEARANCEUR CLEAR* 02/20/2016 0005   LABSPEC 1.024 02/20/2016 0005   PHURINE 5.0 02/20/2016 0005   GLUCOSEU NEGATIVE 02/20/2016 0005   HGBUR NEGATIVE 02/20/2016 0005   BILIRUBINUR NEGATIVE 02/20/2016 0005   KETONESUR NEGATIVE 02/20/2016 0005   PROTEINUR NEGATIVE 02/20/2016 0005   NITRITE NEGATIVE 02/20/2016 0005   LEUKOCYTESUR NEGATIVE  02/20/2016 0005   Sepsis Labs: @LABRCNTIP (procalcitonin:4,lacticidven:4)   )No results found for this or any previous visit (from the past 240 hour(s)).    Radiology Studies: No results found.    Scheduled Meds: . ezetimibe  10 mg Oral Daily  . famotidine (PEPCID) IV  20 mg Intravenous Q12H  . methylphenidate  54 mg Oral Daily  . metoprolol succinate  25 mg Oral Daily  . pantoprazole  40 mg Oral BID  . rosuvastatin  20 mg Oral QHS  . sucralfate  1 g Oral QID   Continuous Infusions: . 0.9 % NaCl with KCl 20 mEq / L 125 mL/hr at 02/21/16 0332     LOS: 1 day    Time spent: 25 minutes  Greater than 50% of the time spent on counseling and coordinating the care.   Henrique Parekh,  Dianely Krehbiel, MD [facility] Pager [phone]  If 7PM-7AM, please contact night-coverage [email or web address] Password [email or web address] 02/21/2016, 10:43 AM

## 2016-02-22 DIAGNOSIS — I10 Essential (primary) hypertension: Secondary | ICD-10-CM | POA: Diagnosis not present

## 2016-02-22 DIAGNOSIS — D62 Acute posthemorrhagic anemia: Secondary | ICD-10-CM | POA: Diagnosis not present

## 2016-02-22 DIAGNOSIS — K920 Hematemesis: Secondary | ICD-10-CM | POA: Diagnosis not present

## 2016-02-22 DIAGNOSIS — K922 Gastrointestinal hemorrhage, unspecified: Secondary | ICD-10-CM | POA: Diagnosis not present

## 2016-02-22 LAB — CBC
HEMATOCRIT: 25.1 % — AB (ref 39.0–52.0)
HEMOGLOBIN: 8.4 g/dL — AB (ref 13.0–17.0)
MCH: 30.2 pg (ref 26.0–34.0)
MCHC: 33.5 g/dL (ref 30.0–36.0)
MCV: 90.3 fL (ref 78.0–100.0)
Platelets: 271 10*3/uL (ref 150–400)
RBC: 2.78 MIL/uL — ABNORMAL LOW (ref 4.22–5.81)
RDW: 13.3 % (ref 11.5–15.5)
WBC: 8.2 10*3/uL (ref 4.0–10.5)

## 2016-02-22 MED ORDER — METOPROLOL SUCCINATE ER 25 MG PO TB24: 25.0000 mg | ORAL_TABLET | Freq: Every day | ORAL | Status: DC

## 2016-02-22 MED ORDER — SUCRALFATE 1 G PO TABS: 1.0000 g | ORAL_TABLET | Freq: Four times a day (QID) | ORAL | Status: DC

## 2016-02-22 MED ORDER — PANTOPRAZOLE SODIUM 40 MG PO TBEC: 40.0000 mg | DELAYED_RELEASE_TABLET | Freq: Two times a day (BID) | ORAL | Status: DC

## 2016-02-22 NOTE — Progress Notes (Signed)
Joshua BonitoSteven Davis Horn to be D/C'd Home per MD order.  Discussed prescriptions and follow up appointments with the patient. Prescriptions given to patient, medication list explained in detail. Pt verbalized understanding.    Medication List    STOP taking these medications        cimetidine 200 MG tablet  Commonly known as:  TAGAMET     dexlansoprazole 60 MG capsule  Commonly known as:  DEXILANT     ibuprofen 200 MG tablet  Commonly known as:  ADVIL,MOTRIN      TAKE these medications        clopidogrel 75 MG tablet  Commonly known as:  PLAVIX  Take 1 tablet (75 mg total) by mouth daily.     ezetimibe 10 MG tablet  Commonly known as:  ZETIA  Take 1 tablet (10 mg total) by mouth daily.     methylphenidate 54 MG CR tablet  Commonly known as:  CONCERTA  Take 54 mg by mouth every morning.     metoprolol succinate 25 MG 24 hr tablet  Commonly known as:  TOPROL-XL  Take 1 tablet (25 mg total) by mouth daily.     pantoprazole 40 MG tablet  Commonly known as:  PROTONIX  Take 1 tablet (40 mg total) by mouth 2 (two) times daily.     rosuvastatin 20 MG tablet  Commonly known as:  CRESTOR  Take 1 tablet (20 mg total) by mouth at bedtime.     sucralfate 1 g tablet  Commonly known as:  CARAFATE  Take 1 tablet (1 g total) by mouth 4 (four) times daily.        Filed Vitals:   02/22/16 0516 02/22/16 0903  BP: 122/58 127/59  Pulse: 77 78  Temp: 99.4 F (37.4 C) 98.3 F (36.8 C)  Resp: 18 18    Skin clean, dry and intact without evidence of skin break down, no evidence of skin tears noted. IV catheter discontinued intact. Site without signs and symptoms of complications. Dressing and pressure applied. Pt denies pain at this time. No complaints noted.  An After Visit Summary was printed and given to the patient. Patient ambulated off unit, and D/C home via private auto.  Joshua ForehandLuke Angelise Horn BSN, RN

## 2016-02-22 NOTE — Progress Notes (Signed)
[facility] PROGRESS NOTE Subjective Pt tolerating full liquids w/o problems, no evidence of continued bleeding.  Objective: Vital signs in last 24 hours: Temp:  [97.9 F (36.6 C)-99.4 F (37.4 C)] 98.3 F (36.8 C) (07/11 0903) Pulse Rate:  [70-78] 78 (07/11 0903) Resp:  [16-18] 18 (07/11 0903) BP: (122-139)/(57-63) 127/59 mmHg (07/11 0903) SpO2:  [96 %-100 %] 97 % (07/11 0903) Weight:  [75.9 kg (167 lb 5.3 oz)] 75.9 kg (167 lb 5.3 oz) (07/10 2017) Last BM Date: 02/20/16  Intake/Output from previous day: 07/10 0701 - 07/11 0700 In: 3705.4 [P.O.:720; I.V.:2885.4; IV Piggyback:100] Out: 2465 [Urine:2465] Intake/Output this shift: Total I/O In: 240 [P.O.:240] Out: 500 [Urine:500]    Lab Results:  Recent Labs  02/19/16 2142 02/20/16 1000 02/21/16 0502 02/22/16 0559  WBC 8.8 8.0 8.1 8.2  HGB 10.3* 8.6* 8.3* 8.4*  HCT 29.9* 26.9* 25.4* 25.1*  PLT 333 310 290 271   BMET  Recent Labs  02/19/16 2142 02/21/16 0502  NA 139 136  K 4.0 4.3  CL 109 107  CO2 25 26  CREATININE 0.72 0.75   LFT  Recent Labs  02/19/16 2142  PROT 7.0  AST 17  ALT 10*  ALKPHOS 97  BILITOT 0.2*   PT/INR No results for input(s): LABPROT, INR in the last 72 hours. PANCREAS  Recent Labs  02/19/16 2142  LIPASE 32         Studies/Results: No results found.  Medications: I have reviewed the patient's current medications.  Assessment/Plan: 1. DU with delayed emptying. ? Partial GOO but doing well with fulls. Long discussion about advancing to low residue for 2-3 weeks, f/u with Dr Matthias HughsBuccini  In 2-3 weeks to see if needs treatment for h pylori. Call [phone] to make an appointment. Would send home on protonix 40 mg BID, don't feel needs pepcid at this time and probably 2 weeks of sulcrafate.   Angelette Ganus JR,Rahmah Mccamy L 02/22/2016, 9:34 AM  This note was created using voice recognition software. Minor errors may Have occurred unintentionally.  Pager: [phone] If no  answer or after hours call [phone]

## 2016-02-22 NOTE — Discharge Instructions (Signed)
Peptic Ulcer ° °A peptic ulcer is a sore in the lining of your esophagus (esophageal ulcer), stomach (gastric ulcer), or in the first part of your small intestine (duodenal ulcer). The ulcer causes erosion into the deeper tissue. °CAUSES  °Normally, the lining of the stomach and the small intestine protects itself from the acid that digests food. The protective lining can be damaged by: °· An infection caused by a bacterium called Helicobacter pylori (H. pylori). °· Regular use of nonsteroidal anti-inflammatory drugs (NSAIDs), such as ibuprofen or aspirin. °· Smoking tobacco. °Other risk factors include being older than 50, drinking alcohol excessively, and having a family history of ulcer disease.  °SYMPTOMS  °· Burning pain or gnawing in the area between the chest and the belly button. °· Heartburn. °· Nausea and vomiting. °· Bloating. °The pain can be worse on an empty stomach and at night. If the ulcer results in bleeding, it can cause: °· Black, tarry stools. °· Vomiting of bright red blood. °· Vomiting of coffee-ground-looking materials. °DIAGNOSIS  °A diagnosis is usually made based upon your history and an exam. Other tests and procedures may be performed to find the cause of the ulcer. Finding a cause will help determine the best treatment. Tests and procedures may include: °· Blood tests, stool tests, or breath tests to check for the bacterium H. pylori. °· An upper gastrointestinal (GI) series of the esophagus, stomach, and small intestine. °· An endoscopy to examine the esophagus, stomach, and small intestine. °· A biopsy. °TREATMENT  °Treatment may include: °· Eliminating the cause of the ulcer, such as smoking, NSAIDs, or alcohol. °· Medicines to reduce the amount of acid in your digestive tract. °· Antibiotic medicines if the ulcer is caused by the H. pylori bacterium. °· An upper endoscopy to treat a bleeding ulcer. °· Surgery if the bleeding is severe or if the ulcer created a hole somewhere in the  digestive system. °HOME CARE INSTRUCTIONS  °· Avoid tobacco, alcohol, and caffeine. Smoking can increase the acid in the stomach, and continued smoking will impair the healing of ulcers. °· Avoid foods and drinks that seem to cause discomfort or aggravate your ulcer. °· Only take medicines as directed by your caregiver. Do not substitute over-the-counter medicines for prescription medicines without talking to your caregiver. °· Keep any follow-up appointments and tests as directed. °SEEK MEDICAL CARE IF:  °· Your do not improve within 7 days of starting treatment. °· You have ongoing indigestion or heartburn. °SEEK IMMEDIATE MEDICAL CARE IF:  °· You have sudden, sharp, or persistent abdominal pain. °· You have bloody or dark black, tarry stools. °· You vomit blood or vomit that looks like coffee grounds. °· You become light-headed, weak, or feel faint. °· You become sweaty or clammy. °MAKE SURE YOU:  °· Understand these instructions. °· Will watch your condition. °· Will get help right away if you are not doing well or get worse. °  °This information is not intended to replace advice given to you by your health care provider. Make sure you discuss any questions you have with your health care provider. °  °Document Released: 07/28/2000 Document Revised: 08/21/2014 Document Reviewed: 02/28/2012 °Elsevier Interactive Patient Education ©2016 [employer] ° °

## 2016-02-22 NOTE — Discharge Summary (Addendum)
Physician Discharge Summary  Joshua Horn Horn:[id number]:[id number] DOB: 09-Apr-1951 DOA: 02/20/2016  PCP: Jaclyn ShaggyATE,DENNY C, MD  Admit date: 02/20/2016 Discharge date: 02/22/2016  Recommendations for Outpatient Follow-up:  Discussed about advancing to low residue for 2-3 weeks, f/u with Dr Matthias HughsBuccini In 2-3 weeks to see if needs treatment for h pylori.  all [phone] to make an appointment.  Would send home on protonix 40 mg BID, don't feel needs pepcid at this time and probably 2 weeks of sulcrafate.    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 Prior to that, he had had some dark stools but not melena. No dizziness or syncope at the time of this episode. The patient takes about 20 ibuprofen's per day because of chronic headaches and is also on Plavix because of a remote coronary stent placement. Pt reported a 6 month history of upper abdominal dyspeptic discomfort but has no prior history of ulcer disease or GI bleeding. He has been using H2 blockers and was just started on Dexilant the day prior to admission.  He presented with hemoglobin of 10.3 (baseline 14). GI has seen him in consultation. 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 No bleeding, no blood present, patient's lesions do not appear to be at high risk for rebleeding.   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 No bleeding, no blood present, patient's lesions do not appear to be at high risk for rebleeding.  - Continue Protonix twice daily on discharge at least for next one month and possibly longer depending on follow-up with GI and continue sucralfate for 2 weeks on discharge - Low residue diet on discharge - Hemoglobin 8.4 this morning  Active Problems:  CAD (coronary artery disease) - Plavix was on hold due to risk of bleeding but per GI no evidence of bleeding so can resume plavix   Essential hypertension - Continue metoprolol   Dyslipidemia - Continue statin therapy    DVT prophylaxis: SCD's bilaterally due to risk of bleeding  Code Status: full code  Family Communication: no family at the bedside this am     Consultants:   GI, Dr. 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 No bleeding, no blood present, patient's lesions do not appear to be at high risk for rebleeding.  Antimicrobials:   None   Signed:  Manson Horn, Joshua Hilliker, MD  [facility] 02/22/2016, 10:16 AM  Pager #: [phone]  Time spent in minutes: less than 30 minutes    Discharge Exam: Filed Vitals:   02/22/16 0516 02/22/16 0903  BP: 122/58 127/59  Pulse: 77 78  Temp: 99.4 F (37.4 C) 98.3 F (36.8 C)  Resp: 18 18  Filed Vitals:   02/21/16 2017 02/21/16 2018 02/22/16 0516 02/22/16 0903  BP:  132/57 122/58 127/59  Pulse:  71 77 78  Temp:  98.9 F (37.2 C) 99.4 F (37.4 C) 98.3 F (36.8 C)  TempSrc:  Oral Oral Oral  Resp:  Height:      Weight: 75.9 kg (167 lb 5.3 oz)     SpO2:  100% 96% 97%    General: Pt is alert, follows commands appropriately, not in acute distress Cardiovascular: Regular  rate and rhythm, S1/S2 +, no murmurs Respiratory: Clear to auscultation bilaterally, no wheezing, no crackles, no rhonchi Abdominal: Soft, non tender, non distended, bowel sounds +, no guarding Extremities: no edema, no cyanosis, pulses palpable bilaterally DP and PT Neuro: Grossly nonfocal  Discharge Instructions  Discharge Instructions    Call MD for:  persistant dizziness or light-headedness    Complete by:  As directed      Call MD for:  redness, tenderness, or signs of infection (pain, swelling, redness, odor or green/yellow discharge around incision site)    Complete by:  As directed      Call MD for:  severe uncontrolled pain    Complete by:  As directed      Diet - low sodium heart healthy    Complete by:  As directed      Discharge instructions    Complete by:  As directed   Discussed about advancing to low residue for 2-3 weeks, f/u with Dr Matthias Hughs In 2-3 weeks to see if needs treatment for h pylori.  all [phone] to make an appointment.  Would send home on protonix 40 mg BID, don't feel needs pepcid at this time and probably 2 weeks of sulcrafate.     Increase activity slowly    Complete by:  As directed             Medication List    STOP taking these medications        cimetidine 200 MG tablet  Commonly known as:  TAGAMET     dexlansoprazole 60 MG capsule  Commonly known as:  DEXILANT     ibuprofen 200 MG tablet  Commonly known as:  ADVIL,MOTRIN      TAKE these medications        clopidogrel 75 MG tablet  Commonly known as:  PLAVIX  Take 1 tablet (75 mg total) by mouth daily.     ezetimibe 10 MG tablet  Commonly known as:  ZETIA  Take 1 tablet (10 mg total) by mouth daily.     methylphenidate 54 MG CR tablet  Commonly known as:  CONCERTA  Take 54 mg by mouth every morning.     metoprolol succinate 25 MG 24 hr tablet  Commonly known as:  TOPROL-XL  Take 1 tablet (25 mg total) by mouth daily.     pantoprazole 40 MG tablet  Commonly known as:   PROTONIX  Take 1 tablet (40 mg total) by mouth 2 (two) times daily.     rosuvastatin 20 MG tablet  Commonly known as:  CRESTOR  Take 1 tablet (20 mg total) by mouth at bedtime.     sucralfate 1 g tablet  Commonly known as:  CARAFATE  Take 1 tablet (1 g total) by mouth 4 (four) times daily.           Follow-up Information    Follow up with TATE,DENNY C, MD. Schedule an appointment as soon as possible for  a visit in 2 weeks.   Specialty:  Internal Medicine   Why:  Follow up appt after recent hospitalization   Contact information:   [address]   [place] [address] [phone]       Follow up with Florencia Reasons, MD. Schedule an appointment as soon as possible for a visit in 2 weeks.   Specialty:  Gastroenterology   Why:  Follow up appt after recent hospitalization   Contact information:   [address] [place] [address] [phone]        The results of significant diagnostics from this hospitalization (including imaging, microbiology, ancillary and laboratory) are listed below for reference.    Significant Diagnostic Studies: No results found.  Microbiology: No results found for this or any previous visit (from the past 240 hour(s)).   Labs: Basic Metabolic Panel:  Recent Labs Lab 02/19/16 2142 02/21/16 0502  NA 139 136  K 4.0 4.3  CL 109 107  CO2 25 26  GLUCOSE 144* 101*  BUN 21* 10  CREATININE 0.72 0.75  CALCIUM 8.8* 8.2*   Liver Function Tests:  Recent Labs Lab 02/19/16 2142  AST 17  ALT 10*  ALKPHOS 97  BILITOT 0.2*  PROT 7.0  ALBUMIN 3.4*    Recent Labs Lab 02/19/16 2142  LIPASE 32   No results for input(s): AMMONIA in the last 168 hours. CBC:  Recent Labs Lab 02/19/16 2142 02/20/16 1000 02/21/16 0502 02/22/16 0559  WBC 8.8 8.0 8.1 8.2  HGB 10.3* 8.6* 8.3* 8.4*  HCT 29.9* 26.9* 25.4* 25.1*  MCV 88.7 91.5 92.0 90.3  PLT 333 310 290 271   Cardiac Enzymes:  Recent Labs Lab 02/19/16 2142   TROPONINI <0.03   BNP: BNP (last 3 results) No results for input(s): BNP in the last 8760 hours.  ProBNP (last 3 results) No results for input(s): PROBNP in the last 8760 hours.  CBG: No results for input(s): GLUCAP in the last 168 hours.

## 2016-03-01 ENCOUNTER — Encounter (HOSPITAL_COMMUNITY): Payer: Self-pay | Admitting: Family Medicine

## 2016-03-01 ENCOUNTER — Emergency Department (HOSPITAL_COMMUNITY): Payer: BC Managed Care – PPO

## 2016-03-01 ENCOUNTER — Emergency Department (HOSPITAL_COMMUNITY)
Admission: EM | Admit: 2016-03-01 | Discharge: 2016-03-01 | Disposition: A | Discharge status: Home / Self Care | Payer: BC Managed Care – PPO | Attending: Emergency Medicine | Admitting: Emergency Medicine

## 2016-03-01 DIAGNOSIS — Z955 Presence of coronary angioplasty implant and graft: Secondary | ICD-10-CM | POA: Diagnosis not present

## 2016-03-01 DIAGNOSIS — Z79899 Other long term (current) drug therapy: Secondary | ICD-10-CM | POA: Insufficient documentation

## 2016-03-01 DIAGNOSIS — K5900 Constipation, unspecified: Secondary | ICD-10-CM | POA: Diagnosis present

## 2016-03-01 DIAGNOSIS — I2510 Atherosclerotic heart disease of native coronary artery without angina pectoris: Secondary | ICD-10-CM | POA: Insufficient documentation

## 2016-03-01 LAB — CBC WITH DIFFERENTIAL/PLATELET
Basophils Absolute: 0 10*3/uL (ref 0.0–0.1)
Basophils Relative: 0 %
EOS PCT: 0 %
Eosinophils Absolute: 0 10*3/uL (ref 0.0–0.7)
HCT: 27.7 % — ABNORMAL LOW (ref 39.0–52.0)
Hemoglobin: 9 g/dL — ABNORMAL LOW (ref 13.0–17.0)
LYMPHS ABS: 1.1 10*3/uL (ref 0.7–4.0)
LYMPHS PCT: 11 %
MCH: 28.8 pg (ref 26.0–34.0)
MCHC: 32.5 g/dL (ref 30.0–36.0)
MCV: 88.5 fL (ref 78.0–100.0)
MONO ABS: 0.9 10*3/uL (ref 0.1–1.0)
Monocytes Relative: 9 %
Neutro Abs: 7.7 10*3/uL (ref 1.7–7.7)
Neutrophils Relative %: 80 %
PLATELETS: 546 10*3/uL — AB (ref 150–400)
RBC: 3.13 MIL/uL — ABNORMAL LOW (ref 4.22–5.81)
RDW: 13.3 % (ref 11.5–15.5)
WBC: 9.8 10*3/uL (ref 4.0–10.5)

## 2016-03-01 LAB — COMPREHENSIVE METABOLIC PANEL
ALBUMIN: 2.7 g/dL — AB (ref 3.5–5.0)
ALT: 16 U/L — ABNORMAL LOW (ref 17–63)
AST: 17 U/L (ref 15–41)
Alkaline Phosphatase: 106 U/L (ref 38–126)
Anion gap: 6 (ref 5–15)
BILIRUBIN TOTAL: 0.4 mg/dL (ref 0.3–1.2)
BUN: 19 mg/dL (ref 6–20)
CHLORIDE: 107 mmol/L (ref 101–111)
CO2: 25 mmol/L (ref 22–32)
Calcium: 8.5 mg/dL — ABNORMAL LOW (ref 8.9–10.3)
Creatinine, Ser: 0.7 mg/dL (ref 0.61–1.24)
GFR calc Af Amer: >60 mL/min (ref >60)
GFR calc non Af Amer: >60 mL/min (ref >60)
GLUCOSE: 112 mg/dL — AB (ref 65–99)
POTASSIUM: 3.1 mmol/L — AB (ref 3.5–5.1)
Sodium: 138 mmol/L (ref 135–145)
Total Protein: 6.1 g/dL — ABNORMAL LOW (ref 6.5–8.1)

## 2016-03-01 LAB — LIPASE, BLOOD: Lipase: 24 U/L (ref 11–51)

## 2016-03-01 MED ORDER — DICYCLOMINE HCL 10 MG PO CAPS
10.0000 mg | ORAL_CAPSULE | Freq: Once | ORAL | Status: AC
  Administered 2016-03-01: 10 mg via ORAL
  Filled 2016-03-01: qty 1

## 2016-03-01 MED ORDER — DICYCLOMINE HCL 20 MG PO TABS: 20.0000 mg | ORAL_TABLET | Freq: Two times a day (BID) | ORAL | Status: DC

## 2016-03-01 MED ORDER — POLYETHYLENE GLYCOL 3350 17 G PO PACK
17.0000 g | PACK | Freq: Every day | ORAL | Status: DC
  Administered 2016-03-01: 17 g via ORAL
  Filled 2016-03-01: qty 1

## 2016-03-01 MED ORDER — POLYETHYLENE GLYCOL 3350 17 G PO PACK: 17.0000 g | PACK | Freq: Every day | ORAL | Status: DC

## 2016-03-01 MED ORDER — SODIUM CHLORIDE 0.9 % IV BOLUS (SEPSIS)
1000.0000 mL | Freq: Once | INTRAVENOUS | Status: AC
  Administered 2016-03-01: 1000 mL via INTRAVENOUS

## 2016-03-01 MED ORDER — DICYCLOMINE HCL 10 MG/ML IM SOLN
20.0000 mg | Freq: Once | INTRAMUSCULAR | Status: DC
Start: 2016-03-01 — End: 2016-03-01

## 2016-03-01 NOTE — ED Notes (Signed)
Pt here for abd spasms and constipation. sts he did a fleet enema last night but still feels like there is something blocking his bowel. Denies vomiting. sts he had some stool from the enema.

## 2016-03-01 NOTE — ED Provider Notes (Signed)
CSN: [id number]     Arrival date & time 03/01/16  1307 History   First MD Initiated Contact with Patient 03/01/16 1415     Chief Complaint  Patient presents with  . Abdominal Pain  . Constipation     (Consider location/radiation/quality/duration/timing/severity/associated sxs/prior Treatment) HPI Joshua Horn is a 65 y.o. male with recent admission in early July for duodenal ulcer, followed by GI-Dr. Matthias Hughs, here for evaluation of abdominal spasm and constipation. Patient reports he was discharged on July 9, but since that time has not had a complete bowel movement. He reports calling Dr. Donavan Burnet office yesterday and was instructed to try Fleet Enema, which he reports was minimally effective. He has not tried anything else for his constipation. Denies any nausea, vomiting, bloody stool, fevers or chills or any overt abdominal pain. Denies any discomfort now emergency department.  Past Medical History  Diagnosis Date  . Hyperlipidemia   . Coronary artery disease     s/p stent   . Depression   . ADHD (attention deficit hyperactivity disorder)    Past Surgical History  Procedure Laterality Date  . Cardiac catheterization  11/29/2012  . Coronary angioplasty    . Esophagogastroduodenoscopy N/A 02/20/2016    Procedure: ESOPHAGOGASTRODUODENOSCOPY (EGD);  Surgeon: Bernette Redbird, MD;  Location: [facility] ENDOSCOPY;  Service: Endoscopy;  Laterality: N/A;   Family History  Problem Relation Age of Onset  . Heart attack Father 67  . Stroke Mother    Social History  Substance Use Topics  . Smoking status: Never Smoker   . Smokeless tobacco: None  . Alcohol Use: No    Review of Systems A 10 point review of systems was completed and was negative except for pertinent positives and negatives as mentioned in the history of present illness     Allergies  Sulfa antibiotics and Bupropion  Home Medications   Prior to Admission medications   Medication Sig Start Date End Date Taking?  Authorizing Provider  clopidogrel (PLAVIX) 75 MG tablet Take 1 tablet (75 mg total) by mouth daily. 06/11/15   Antonieta Iba, MD  dicyclomine (BENTYL) 20 MG tablet Take 1 tablet (20 mg total) by mouth 2 (two) times daily. 03/01/16   Joycie Peek, PA-C  ezetimibe (ZETIA) 10 MG tablet Take 1 tablet (10 mg total) by mouth daily. 06/11/15   Antonieta Iba, MD  methylphenidate (CONCERTA) 54 MG CR tablet Take 54 mg by mouth every morning.    Historical Provider, MD  metoprolol succinate (TOPROL-XL) 25 MG 24 hr tablet Take 1 tablet (25 mg total) by mouth daily. 02/22/16   Alison Murray, MD  pantoprazole (PROTONIX) 40 MG tablet Take 1 tablet (40 mg total) by mouth 2 (two) times daily. 02/22/16   Alison Murray, MD  polyethylene glycol [facility] / Ethelene Hal) packet Take 17 g by mouth daily. 03/01/16   Joycie Peek, PA-C  rosuvastatin (CRESTOR) 20 MG tablet Take 1 tablet (20 mg total) by mouth at bedtime. 02/09/12   Antonieta Iba, MD  sucralfate (CARAFATE) 1 g tablet Take 1 tablet (1 g total) by mouth 4 (four) times daily. 02/22/16   Alison Murray, MD   BP 149/81 mmHg  Pulse 98  Temp(Src) 97.5 F (36.4 C) (Oral)  Resp 16  SpO2 100% Physical Exam  Constitutional: He is oriented to person, place, and time. He appears well-developed and well-nourished.  HENT:  Head: Normocephalic and atraumatic.  Mouth/Throat: Oropharynx is clear and moist.  Moderately dry mucous membranes.  Eyes: Conjunctivae are normal. Pupils are equal, round, and reactive to light. Right eye exhibits no discharge. Left eye exhibits no discharge. No scleral icterus.  Neck: Neck supple.  Cardiovascular: Normal rate, regular rhythm and normal heart sounds.   Pulmonary/Chest: Effort normal and breath sounds normal. No respiratory distress. He has no wheezes. He has no rales.  Abdominal: Soft. He exhibits no distension and no mass. There is no tenderness. There is no rebound and no guarding.  Musculoskeletal: He exhibits no  tenderness.  Neurological: He is alert and oriented to person, place, and time.  Cranial Nerves II-XII grossly intact  Skin: Skin is warm and dry. No rash noted.  Psychiatric: He has a normal mood and affect.  Nursing note and vitals reviewed.   ED Course  Procedures (including critical care time) Labs Review Labs Reviewed  COMPREHENSIVE METABOLIC PANEL - Abnormal; Notable for the following:    Potassium 3.1 (*)    Glucose, Bld 112 (*)    Calcium 8.5 (*)    Total Protein 6.1 (*)    Albumin 2.7 (*)    ALT 16 (*)    All other components within normal limits  CBC WITH DIFFERENTIAL/PLATELET - Abnormal; Notable for the following:    RBC 3.13 (*)    Hemoglobin 9.0 (*)    HCT 27.7 (*)    Platelets 546 (*)    All other components within normal limits  LIPASE, BLOOD    Imaging Review Dg Abd Acute W/chest  03/01/2016  CLINICAL DATA:  Constipation for 2 weeks. Patient took Fleet enema yesterday, spasms since that time. EXAM: DG ABDOMEN ACUTE W/ 1V CHEST COMPARISON:  CT 09/26/2015 FINDINGS: The cardiomediastinal contours are normal. The lungs are clear. Gaseous distention of colon in the central abdomen likely redundant sigmoid. There is moderate stool in the proximal colon. No small bowel dilatation. No evidence of free air. No radiopaque calculi. No acute osseous abnormalities are seen. IMPRESSION: 1. Gaseous distension of redundant sigmoid colon, may be due to recent enema. Moderate stool proximally in the colon. No evidence of bowel obstruction or free air. 2.  No acute pulmonary process. Electronically Signed   By: Rubye OaksMelanie  Ehinger M.D.   On: 03/01/2016 17:10   I have personally reviewed and evaluated these images and lab results as part of my medical decision-making.   EKG Interpretation None      MDM  Patient presents for constipation and abdominal spasm. No nausea, vomiting or other signs or symptoms concerning for small bowel structure. On arrival, hemodynamically stable and  afebrile. Physical exam is unremarkable, abdomen is soft and nontender. No peritoneal signs. Patient does have mildly dry mucous membranes, plan to give 1 L normal saline bolus. MiraLAX given in ED. Pending screening labs, plain films of abdomen to evaluate for stool burden. Patient reports relief with Bentyl. Plain films show no evidence of obstruction. There is gaseous distention of redundant sigmoid colon, possibly due to recent enema. Moderate stool in the proximal colon. Will DC with prescription for Bentyl, MiraLAX. Discussed with fiber diet. Follow-up with gastroenterology next week. Discussed strict return precautions. He verbalizes understanding and agrees with plan and subsequent discharge. Final diagnoses:  Constipation, unspecified constipation type       Joycie PeekBenjamin Johnson [place], PA-C 03/01/16 1731  Maia PlanJoshua G Long, MD 03/01/16 [phone]

## 2016-03-01 NOTE — Discharge Instructions (Signed)
Least take your medications as we discussed. Follow up with your doctor next week for reevaluation. Return to ED for new or worsening symptoms as we discussed.  Constipation, Adult Constipation is when a person has fewer than three bowel movements a week, has difficulty having a bowel movement, or has stools that are dry, hard, or larger than normal. As people grow older, constipation is more common. A low-fiber diet, not taking in enough fluids, and taking certain medicines may make constipation worse.  CAUSES   Certain medicines, such as antidepressants, pain medicine, iron supplements, antacids, and water pills.   Certain diseases, such as diabetes, irritable bowel syndrome (IBS), thyroid disease, or depression.   Not drinking enough water.   Not eating enough fiber-rich foods.   Stress or travel.   Lack of physical activity or exercise.   Ignoring the urge to have a bowel movement.   Using laxatives too much.  SIGNS AND SYMPTOMS   Having fewer than three bowel movements a week.   Straining to have a bowel movement.   Having stools that are hard, dry, or larger than normal.   Feeling full or bloated.   Pain in the lower abdomen.   Not feeling relief after having a bowel movement.  DIAGNOSIS  Your health care provider will take a medical history and perform a physical exam. Further testing may be done for severe constipation. Some tests may include:  A barium enema X-ray to examine your rectum, colon, and, sometimes, your small intestine.   A sigmoidoscopy to examine your lower colon.   A colonoscopy to examine your entire colon. TREATMENT  Treatment will depend on the severity of your constipation and what is causing it. Some dietary treatments include drinking more fluids and eating more fiber-rich foods. Lifestyle treatments may include regular exercise. If these diet and lifestyle recommendations do not help, your health care provider may recommend  taking over-the-counter laxative medicines to help you have bowel movements. Prescription medicines may be prescribed if over-the-counter medicines do not work.  HOME CARE INSTRUCTIONS   Eat foods that have a lot of fiber, such as fruits, vegetables, whole grains, and beans.  Limit foods high in fat and processed sugars, such as french fries, hamburgers, cookies, candies, and soda.   A fiber supplement may be added to your diet if you cannot get enough fiber from foods.   Drink enough fluids to keep your urine clear or pale yellow.   Exercise regularly or as directed by your health care provider.   Go to the restroom when you have the urge to go. Do not hold it.   Only take over-the-counter or prescription medicines as directed by your health care provider. Do not take other medicines for constipation without talking to your health care provider first.  SEEK IMMEDIATE MEDICAL CARE IF:   You have bright red blood in your stool.   Your constipation lasts for more than 4 days or gets worse.   You have abdominal or rectal pain.   You have thin, pencil-like stools.   You have unexplained weight loss. MAKE SURE YOU:   Understand these instructions.  Will watch your condition.  Will get help right away if you are not doing well or get worse.   This information is not intended to replace advice given to you by your health care provider. Make sure you discuss any questions you have with your health care provider.   Document Released: 04/28/2004 Document Revised: 08/21/2014 Document Reviewed: 05/12/2013  Elsevier Interactive Patient Education 2016 Elsevier Inc.  High-Fiber Diet Fiber, also called dietary fiber, is a type of carbohydrate found in fruits, vegetables, whole grains, and beans. A high-fiber diet can have many health benefits. Your health care provider may recommend a high-fiber diet to help:  Prevent constipation. Fiber can make your bowel movements more  regular.  Lower your cholesterol.  Relieve hemorrhoids, uncomplicated diverticulosis, or irritable bowel syndrome.  Prevent overeating as part of a weight-loss plan.  Prevent heart disease, type 2 diabetes, and certain cancers. WHAT IS MY PLAN? The recommended daily intake of fiber includes:  38 grams for men under age 64.  30 grams for men over age 64.  25 grams for women under age 64.  21 grams for women over age 650. You can get the recommended daily intake of dietary fiber by eating a variety of fruits, vegetables, grains, and beans. Your health care provider may also recommend a fiber supplement if it is not possible to get enough fiber through your diet. WHAT DO I NEED TO KNOW ABOUT A HIGH-FIBER DIET?  Fiber supplements have not been widely studied for their effectiveness, so it is better to get fiber through food sources.  Always check the fiber content on thenutrition facts label of any prepackaged food. Look for foods that contain at least 5 grams of fiber per serving.  Ask your dietitian if you have questions about specific foods that are related to your condition, especially if those foods are not listed in the following section.  Increase your daily fiber consumption gradually. Increasing your intake of dietary fiber too quickly may cause bloating, cramping, or gas.  Drink plenty of water. Water helps you to digest fiber. WHAT FOODS CAN I EAT? Grains Whole-grain breads. Multigrain cereal. Oats and oatmeal. Brown rice. Barley. Bulgur wheat. Millet. Bran muffins. Popcorn. Rye wafer crackers. Vegetables Sweet potatoes. Spinach. Kale. Artichokes. Cabbage. Broccoli. Green peas. Carrots. Squash. Fruits Berries. Pears. Apples. Oranges. Avocados. Prunes and raisins. Dried figs. Meats and Other Protein Sources Navy, kidney, pinto, and soy beans. Split peas. Lentils. Nuts and seeds. Dairy Fiber-fortified yogurt. Beverages Fiber-fortified soy milk. Fiber-fortified orange  juice. Other Fiber bars. The items listed above may not be a complete list of recommended foods or beverages. Contact your dietitian for more options. WHAT FOODS ARE NOT RECOMMENDED? Grains White bread. Pasta made with refined flour. White rice. Vegetables Fried potatoes. Canned vegetables. Well-cooked vegetables.  Fruits Fruit juice. Cooked, strained fruit. Meats and Other Protein Sources Fatty cuts of meat. Fried Environmental education officerpoultry or fried fish. Dairy Milk. Yogurt. Cream cheese. Sour cream. Beverages Soft drinks. Other Cakes and pastries. Butter and oils. The items listed above may not be a complete list of foods and beverages to avoid. Contact your dietitian for more information. WHAT ARE SOME TIPS FOR INCLUDING HIGH-FIBER FOODS IN MY DIET?  Eat a wide variety of high-fiber foods.  Make sure that half of all grains consumed each day are whole grains.  Replace breads and cereals made from refined flour or white flour with whole-grain breads and cereals.  Replace white rice with brown rice, bulgur wheat, or millet.  Start the day with a breakfast that is high in fiber, such as a cereal that contains at least 5 grams of fiber per serving.  Use beans in place of meat in soups, salads, or pasta.  Eat high-fiber snacks, such as berries, raw vegetables, nuts, or popcorn.   This information is not intended to replace advice given to you  by your health care provider. Make sure you discuss any questions you have with your health care provider.   Document Released: 07/31/2005 Document Revised: 08/21/2014 Document Reviewed: 01/13/2014 Elsevier Interactive Patient Education [employer]2016 Elsevier Inc.

## 2016-03-01 NOTE — ED Notes (Signed)
Patient went to xray 

## 2016-03-26 ENCOUNTER — Inpatient Hospital Stay
Admission: EM | Admit: 2016-03-26 | Discharge: 2016-03-29 | DRG: 384 | Disposition: A | Discharge status: Other Acute Inpatient Hospital | Payer: BC Managed Care – PPO | Attending: Internal Medicine | Admitting: Internal Medicine

## 2016-03-26 ENCOUNTER — Emergency Department: Payer: BC Managed Care – PPO

## 2016-03-26 ENCOUNTER — Encounter: Payer: Self-pay | Admitting: Emergency Medicine

## 2016-03-26 DIAGNOSIS — Z955 Presence of coronary angioplasty implant and graft: Secondary | ICD-10-CM | POA: Diagnosis not present

## 2016-03-26 DIAGNOSIS — K259 Gastric ulcer, unspecified as acute or chronic, without hemorrhage or perforation: Secondary | ICD-10-CM | POA: Diagnosis present

## 2016-03-26 DIAGNOSIS — Z823 Family history of stroke: Secondary | ICD-10-CM | POA: Diagnosis not present

## 2016-03-26 DIAGNOSIS — R634 Abnormal weight loss: Secondary | ICD-10-CM | POA: Diagnosis present

## 2016-03-26 DIAGNOSIS — Z79899 Other long term (current) drug therapy: Secondary | ICD-10-CM

## 2016-03-26 DIAGNOSIS — F329 Major depressive disorder, single episode, unspecified: Secondary | ICD-10-CM | POA: Diagnosis present

## 2016-03-26 DIAGNOSIS — Z7901 Long term (current) use of anticoagulants: Secondary | ICD-10-CM | POA: Diagnosis not present

## 2016-03-26 DIAGNOSIS — K269 Duodenal ulcer, unspecified as acute or chronic, without hemorrhage or perforation: Secondary | ICD-10-CM | POA: Diagnosis present

## 2016-03-26 DIAGNOSIS — R112 Nausea with vomiting, unspecified: Secondary | ICD-10-CM | POA: Diagnosis present

## 2016-03-26 DIAGNOSIS — E785 Hyperlipidemia, unspecified: Secondary | ICD-10-CM | POA: Diagnosis present

## 2016-03-26 DIAGNOSIS — E860 Dehydration: Secondary | ICD-10-CM | POA: Diagnosis present

## 2016-03-26 DIAGNOSIS — T39395A Adverse effect of other nonsteroidal anti-inflammatory drugs [NSAID], initial encounter: Secondary | ICD-10-CM | POA: Diagnosis present

## 2016-03-26 DIAGNOSIS — Z6821 Body mass index (BMI) 21.0-21.9, adult: Secondary | ICD-10-CM | POA: Diagnosis not present

## 2016-03-26 DIAGNOSIS — R110 Nausea: Secondary | ICD-10-CM

## 2016-03-26 DIAGNOSIS — K315 Obstruction of duodenum: Secondary | ICD-10-CM | POA: Diagnosis present

## 2016-03-26 DIAGNOSIS — F909 Attention-deficit hyperactivity disorder, unspecified type: Secondary | ICD-10-CM | POA: Diagnosis present

## 2016-03-26 DIAGNOSIS — I2510 Atherosclerotic heart disease of native coronary artery without angina pectoris: Secondary | ICD-10-CM | POA: Diagnosis present

## 2016-03-26 DIAGNOSIS — Z8249 Family history of ischemic heart disease and other diseases of the circulatory system: Secondary | ICD-10-CM | POA: Diagnosis not present

## 2016-03-26 DIAGNOSIS — K311 Adult hypertrophic pyloric stenosis: Secondary | ICD-10-CM

## 2016-03-26 DIAGNOSIS — K5660 Unspecified intestinal obstruction: Secondary | ICD-10-CM | POA: Diagnosis present

## 2016-03-26 LAB — COMPREHENSIVE METABOLIC PANEL
ALK PHOS: 119 U/L (ref 38–126)
ALT: 36 U/L (ref 17–63)
AST: 32 U/L (ref 15–41)
Albumin: 4 g/dL (ref 3.5–5.0)
Anion gap: 9 (ref 5–15)
BUN: 24 mg/dL — AB (ref 6–20)
CALCIUM: 9.2 mg/dL (ref 8.9–10.3)
CO2: 20 mmol/L — AB (ref 22–32)
CREATININE: 0.85 mg/dL (ref 0.61–1.24)
Chloride: 106 mmol/L (ref 101–111)
GFR calc non Af Amer: >60 mL/min (ref >60)
GLUCOSE: 110 mg/dL — AB (ref 65–99)
Potassium: 4.1 mmol/L (ref 3.5–5.1)
SODIUM: 135 mmol/L (ref 135–145)
Total Bilirubin: 0.5 mg/dL (ref 0.3–1.2)
Total Protein: 8.3 g/dL — ABNORMAL HIGH (ref 6.5–8.1)

## 2016-03-26 LAB — MAGNESIUM: MAGNESIUM: 2 mg/dL (ref 1.7–2.4)

## 2016-03-26 LAB — CBC
HCT: 33.4 % — ABNORMAL LOW (ref 40.0–52.0)
Hemoglobin: 11.6 g/dL — ABNORMAL LOW (ref 13.0–18.0)
MCH: 28.9 pg (ref 26.0–34.0)
MCHC: 34.6 g/dL (ref 32.0–36.0)
MCV: 83.6 fL (ref 80.0–100.0)
PLATELETS: 520 10*3/uL — AB (ref 150–440)
RBC: 4 MIL/uL — AB (ref 4.40–5.90)
RDW: 15.2 % — AB (ref 11.5–14.5)
WBC: 10.2 10*3/uL (ref 3.8–10.6)

## 2016-03-26 LAB — LIPASE, BLOOD: Lipase: 30 U/L (ref 11–51)

## 2016-03-26 LAB — TROPONIN I: Troponin I: <0.03 ng/mL (ref <0.03)

## 2016-03-26 MED ORDER — POLYETHYLENE GLYCOL 3350 17 G PO PACK
17.0000 g | PACK | Freq: Every day | ORAL | Status: DC
  Administered 2016-03-27: 17 g via ORAL
  Filled 2016-03-26: qty 1

## 2016-03-26 MED ORDER — ONDANSETRON HCL 4 MG/2ML IJ SOLN
4.0000 mg | Freq: Four times a day (QID) | INTRAMUSCULAR | Status: DC | PRN
Start: 2016-03-26 — End: 2016-03-29
  Administered 2016-03-26 – 2016-03-28 (×4): 4 mg via INTRAVENOUS
  Filled 2016-03-26 (×4): qty 2

## 2016-03-26 MED ORDER — ACETAMINOPHEN 650 MG RE SUPP
650.0000 mg | Freq: Four times a day (QID) | RECTAL | Status: DC | PRN
  Administered 2016-03-28: 650 mg via RECTAL
  Filled 2016-03-26: qty 1

## 2016-03-26 MED ORDER — SODIUM CHLORIDE 0.9 % IV BOLUS (SEPSIS): 2000.0000 mL | Freq: Once | INTRAVENOUS | Status: DC

## 2016-03-26 MED ORDER — SODIUM CHLORIDE 0.9 % IV SOLN
Freq: Once | INTRAVENOUS | Status: AC
  Administered 2016-03-26: 17:00:00 via INTRAVENOUS

## 2016-03-26 MED ORDER — SODIUM CHLORIDE 0.9 % IV SOLN
80.0000 mg | Freq: Once | INTRAVENOUS | Status: AC
  Administered 2016-03-26: 80 mg via INTRAVENOUS
  Filled 2016-03-26: qty 80

## 2016-03-26 MED ORDER — METOCLOPRAMIDE HCL 5 MG/ML IJ SOLN
10.0000 mg | Freq: Once | INTRAMUSCULAR | Status: AC
  Administered 2016-03-26: 10 mg via INTRAVENOUS
  Filled 2016-03-26: qty 2

## 2016-03-26 MED ORDER — EZETIMIBE 10 MG PO TABS
10.0000 mg | ORAL_TABLET | Freq: Every day | ORAL | Status: DC
  Administered 2016-03-27: 10:00:00 10 mg via ORAL
  Filled 2016-03-26: qty 1

## 2016-03-26 MED ORDER — SODIUM CHLORIDE 0.9 % IV BOLUS (SEPSIS)
1000.0000 mL | Freq: Once | INTRAVENOUS | Status: AC
  Administered 2016-03-26: 1000 mL via INTRAVENOUS

## 2016-03-26 MED ORDER — DICYCLOMINE HCL 10 MG PO CAPS
20.0000 mg | ORAL_CAPSULE | Freq: Two times a day (BID) | ORAL | Status: DC
  Administered 2016-03-27: 20 mg via ORAL
  Filled 2016-03-26 (×2): qty 2

## 2016-03-26 MED ORDER — METHYLPHENIDATE HCL ER (OSM) 27 MG PO TBCR: 54.0000 mg | EXTENDED_RELEASE_TABLET | ORAL | Status: DC

## 2016-03-26 MED ORDER — SODIUM CHLORIDE 0.9 % IV SOLN
INTRAVENOUS | Status: DC
  Administered 2016-03-26 – 2016-03-28 (×4): via INTRAVENOUS

## 2016-03-26 MED ORDER — ONDANSETRON HCL 4 MG PO TABS: 4.0000 mg | ORAL_TABLET | Freq: Four times a day (QID) | ORAL | Status: DC | PRN

## 2016-03-26 MED ORDER — ROSUVASTATIN CALCIUM 20 MG PO TABS
20.0000 mg | ORAL_TABLET | Freq: Every day | ORAL | Status: DC
  Filled 2016-03-26: qty 1

## 2016-03-26 MED ORDER — ACETAMINOPHEN 325 MG PO TABS
650.0000 mg | ORAL_TABLET | Freq: Four times a day (QID) | ORAL | Status: DC | PRN
  Administered 2016-03-27: 650 mg via ORAL
  Filled 2016-03-26: qty 2

## 2016-03-26 MED ORDER — PROMETHAZINE HCL 25 MG/ML IJ SOLN
12.5000 mg | INTRAMUSCULAR | Status: AC | PRN
  Administered 2016-03-26: 12.5 mg via INTRAVENOUS
  Filled 2016-03-26: qty 1

## 2016-03-26 MED ORDER — METOPROLOL SUCCINATE ER 25 MG PO TB24
25.0000 mg | ORAL_TABLET | Freq: Every day | ORAL | Status: DC
  Administered 2016-03-27: 10:00:00 25 mg via ORAL
  Filled 2016-03-26: qty 1

## 2016-03-26 MED ORDER — SUCRALFATE 1 G PO TABS
1.0000 g | ORAL_TABLET | Freq: Four times a day (QID) | ORAL | Status: DC
  Administered 2016-03-26 – 2016-03-27 (×2): 1 g via ORAL
  Filled 2016-03-26 (×3): qty 1

## 2016-03-26 MED ORDER — SODIUM CHLORIDE 0.9 % IV SOLN
8.0000 mg/h | INTRAVENOUS | Status: DC
Start: 2016-03-26 — End: 2016-03-29
  Administered 2016-03-27 – 2016-03-29 (×4): 8 mg/h via INTRAVENOUS
  Filled 2016-03-26 (×5): qty 80

## 2016-03-26 MED ORDER — PANTOPRAZOLE SODIUM 40 MG IV SOLR: 40.0000 mg | Freq: Two times a day (BID) | INTRAVENOUS | Status: DC

## 2016-03-26 MED ORDER — ONDANSETRON HCL 4 MG/2ML IJ SOLN
4.0000 mg | Freq: Once | INTRAMUSCULAR | Status: AC
  Administered 2016-03-26: 4 mg via INTRAVENOUS
  Filled 2016-03-26: qty 2

## 2016-03-26 NOTE — ED Triage Notes (Signed)
Patient has recent hx of bleeding duodenal ulcer and hospitalized here for same. Now presents with N/V and inability to hold PO intake for >24 hours. Patient states emesis is flecked with dark red blood. Normal stool

## 2016-03-26 NOTE — H&P (Signed)
[facility] [place] [facility]   PATIENT NAME: Joshua Horn    MR#:  [id number]  DATE OF BIRTH:  [birth year]  DATE OF ADMISSION:  03/26/2016  PRIMARY CARE PHYSICIAN: Jaclyn Shaggy, MD   REQUESTING/REFERRING PHYSICIAN: Dr. Sharyn Creamer  CHIEF COMPLAINT:   Chief Complaint  Patient presents with  . Emesis    HISTORY OF PRESENT ILLNESS:  Joshua Horn  is a 65 y.o. male with a known history of ADHD, CAD status post stent in 2010, depression and hyperlipidemia presents to hospital secondary to intractable nausea and vomiting that started last night. Patient's symptoms started about 6 months ago with on and off epigastric pain that was gradually worsening to the point that last month he was admitted with hematemesis and abdominal pain. He was transferred to [facility] as there was no GI coverage here and had an upper GI endoscopy done in July 2017. He had significant erosions in the stomach, also huge duodenal ulcer. He was advised to stop taking NSAIDS, and prescribed PPI and Carafate. He stopped the Carafate due to constipation issues. Since his discharge at [facility], he still has been having nausea and vomiting, decreased by mouth intake and lost huge amounts of weight in the past month. Since last night, his nausea, vomitings have been constant, intractable and so presented to the ER. Also had some hematemesis with dark blood and no active bleeding.  PAST MEDICAL HISTORY:   Past Medical History:  Diagnosis Date  . ADHD (attention deficit hyperactivity disorder)   . Coronary artery disease    s/p stent   . Depression   . Hyperlipidemia     PAST SURGICAL HISTORY:   Past Surgical History:  Procedure Laterality Date  . CARDIAC CATHETERIZATION  11/29/2012  . CORONARY ANGIOPLASTY    . ESOPHAGOGASTRODUODENOSCOPY N/A 02/20/2016   Procedure: ESOPHAGOGASTRODUODENOSCOPY (EGD);  Surgeon: Bernette Redbird, MD;  Location: [facility] ENDOSCOPY;  Service: Endoscopy;   Laterality: N/A;    SOCIAL HISTORY:   Social History  Substance Use Topics  . Smoking status: Never Smoker  . Smokeless tobacco: Not on file  . Alcohol use No    FAMILY HISTORY:   Family History  Problem Relation Age of Onset  . Heart attack Father 53  . Stroke Mother     DRUG ALLERGIES:   Allergies  Allergen Reactions  . Sulfa Antibiotics     rash  . Bupropion Rash    REVIEW OF SYSTEMS:   Review of Systems  Constitutional: Positive for malaise/fatigue and weight loss. Negative for chills and fever.  HENT: Negative for ear discharge, ear pain, hearing loss and nosebleeds.   Eyes: Negative for blurred vision, double vision and photophobia.  Respiratory: Negative for cough, hemoptysis, shortness of breath and wheezing.   Cardiovascular: Negative for chest pain, palpitations, orthopnea and leg swelling.  Gastrointestinal: Positive for constipation, heartburn, nausea and vomiting. Negative for abdominal pain, diarrhea and melena.  Genitourinary: Negative for dysuria, frequency, hematuria and urgency.  Musculoskeletal: Positive for myalgias. Negative for back pain and neck pain.  Skin: Negative for rash.  Neurological: Negative for dizziness, tingling, sensory change, speech change, focal weakness and headaches.  Endo/Heme/Allergies: Does not bruise/bleed easily.  Psychiatric/Behavioral: Negative for depression.    MEDICATIONS AT HOME:   Prior to Admission medications   Medication Sig Start Date End Date Taking? Authorizing Provider  clopidogrel (PLAVIX) 75 MG tablet Take 1 tablet (75 mg total) by mouth daily. 06/11/15   Antonieta Iba,  MD  dicyclomine (BENTYL) 20 MG tablet Take 1 tablet (20 mg total) by mouth 2 (two) times daily. 03/01/16   Joycie PeekBenjamin Cartner, PA-C  ezetimibe (ZETIA) 10 MG tablet Take 1 tablet (10 mg total) by mouth daily. 06/11/15   Antonieta Ibaimothy J Gollan, MD  methylphenidate (CONCERTA) 54 MG CR tablet Take 54 mg by mouth every morning.    Historical  Provider, MD  metoprolol succinate (TOPROL-XL) 25 MG 24 hr tablet Take 1 tablet (25 mg total) by mouth daily. 02/22/16   Alison MurrayAlma M Devine, MD  pantoprazole (PROTONIX) 40 MG tablet Take 1 tablet (40 mg total) by mouth 2 (two) times daily. 02/22/16   Alison MurrayAlma M Devine, MD  polyethylene glycol [facility](MIRALAX / Ethelene HalGLYCOLAX) packet Take 17 g by mouth daily. 03/01/16   Joycie PeekBenjamin Cartner, PA-C  rosuvastatin (CRESTOR) 20 MG tablet Take 1 tablet (20 mg total) by mouth at bedtime. 02/09/12   Antonieta Ibaimothy J Gollan, MD  sucralfate (CARAFATE) 1 g tablet Take 1 tablet (1 g total) by mouth 4 (four) times daily. 02/22/16   Alison MurrayAlma M Devine, MD      VITAL SIGNS:  Blood pressure (!) 146/77, pulse 91, temperature 97.6 F (36.4 C), temperature source Oral, resp. rate 18, height 6' (1.829 m), weight 68 kg (150 lb), SpO2 97 %.  PHYSICAL EXAMINATION:   Physical Exam  GENERAL:  65 y.o.-year-old, ill nourished appearing patient lying in the bed with no acute distress.  EYES: Pupils equal, round, reactive to light and accommodation. No scleral icterus. Extraocular muscles intact. Bruise under left eye HEENT: Head atraumatic, normocephalic. Oropharynx and nasopharynx clear.  NECK:  Supple, no jugular venous distention. No thyroid enlargement, no tenderness.  LUNGS: Normal breath sounds bilaterally, no wheezing, rales,rhonchi or crepitation. No use of accessory muscles of respiration.  CARDIOVASCULAR: S1, S2 normal. No murmurs, rubs, or gallops.  ABDOMEN: Soft, nontender, nondistended. Bowel sounds present. No organomegaly or mass.  EXTREMITIES: No pedal edema, cyanosis, or clubbing.  NEUROLOGIC: Cranial nerves II through XII are intact. Muscle strength 5/5 in all extremities. Sensation intact. Gait not checked.  PSYCHIATRIC: The patient is alert and oriented x 3.  SKIN: No obvious rash, lesion, or ulcer.   LABORATORY PANEL:   CBC  Recent Labs Lab 03/26/16 1542  WBC 10.2  HGB 11.6*  HCT 33.4*  PLT 520*    ------------------------------------------------------------------------------------------------------------------  Chemistries   Recent Labs Lab 03/26/16 1542  NA 135  K 4.1  CL 106  CO2 20*  GLUCOSE 110*  BUN 24*  CREATININE 0.85  CALCIUM 9.2  MG 2.0  AST 32  ALT 36  ALKPHOS 119  BILITOT 0.5   ------------------------------------------------------------------------------------------------------------------  Cardiac Enzymes  Recent Labs Lab 03/26/16 1542  TROPONINI <0.03   ------------------------------------------------------------------------------------------------------------------  RADIOLOGY:  Dg Abd 2 Views  Result Date: 03/26/2016 CLINICAL DATA:  Intractable nausea and vomiting for 8 months. Hematemesis. EXAM: ABDOMEN - 2 VIEW COMPARISON:  03/01/2016 FINDINGS: Colonic distention has resolved since previous study. There is persistent moderate stool burden throughout the colon. No evidence of dilated small bowel loops. No evidence of free air. No radiopaque calculi identified. IMPRESSION: No acute findings.  Moderate colonic stool noted. Electronically Signed   By: Myles RosenthalJohn  Stahl M.D.   On: 03/26/2016 17:32    EKG:   Orders placed or performed during the hospital encounter of 03/26/16  . ED EKG  . ED EKG  . EKG 12-Lead  . EKG 12-Lead    IMPRESSION AND PLAN:   Joshua SmolderSteven Horn  is a 65  y.o. male with a known history of ADHD, CAD status post stent in 2010, depression and hyperlipidemia presents to hospital secondary to intractable nausea and vomiting that started last night.  #1 Intractable nausea and vomiting- ? Duodenal obstruction from the duodenal ulcer- no mechanical obstruction based on KUB - IV fluids, zofran - NPO and GI consult - will need repeat EGD again   #2 Duodenal ulcer- plan as above, IV protonix - carafate and NPO  #3 Hyperlipidemia- statin  #4 Anemia- hb last month at discharge around 8.5, now at 11- could be hemoconcentration from  dehydration - IV fluids and close monitoring  #5 DVT Prophylaxis- TEDs and SCDs    All the records are reviewed and case discussed with ED provider. Management plans discussed with the patient, family and they are in agreement.  CODE STATUS: Full Code  TOTAL TIME TAKING CARE OF THIS PATIENT: 50 minutes.    Enid Baas M.D on 03/26/2016 at 9:21 PM  Between 7am to 6pm - Pager - [phone]  After 6pm go to [email or web address] - password [email or web address]  [facility]  Office  [phone]  CC: Primary care physician; Jaclyn Shaggy, MD

## 2016-03-26 NOTE — ED Provider Notes (Signed)
[facility] Emergency Department Provider Note   ____________________________________________   First MD Initiated Contact with Patient 03/26/16 1652     (approximate)  I have reviewed the triage vital signs and the nursing notes.   HISTORY  Chief Complaint Emesis    HPI Joshua Horn is a 65 y.o. 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  Patient reports that he has intermittent nausea every few days, and occasionally vomits for the last month after being diagnosed with a large ulcer in the stomach. Over the last 2 days he's had unremitting nausea and improved by Phenergan suppositories, he reports he's vomited about 6 times and has not been able to eat anything for 24 hours due to nausea. He denies being in pain, is not a fevers or chills. No chest pain or shortness of breath.  Patient reports that he has lost about 30 pounds of weight in the last 2 months due to this issue.  Reports he's had similar symptoms also in March where he had a CT scan, and last had an endoscopy about a month ago while admitted [facility]. He has not noticed any large amounts of blood or black vomit, reports he is vomiting up food and can't keep anything down. In addition, he has not had a large black or bloody bowel movements. He is still passing gas normally.   Past Medical History:  Diagnosis Date  . ADHD (attention deficit hyperactivity disorder)   . Coronary artery disease    s/p stent   . Depression   . Hyperlipidemia     Patient Active Problem List   Diagnosis Date Noted  . Upper GI bleed 02/20/2016  . Acute blood loss anemia 02/20/2016  . Chest pain 11/22/2012  . CAD (coronary artery disease) 02/09/2012  . Essential hypertension 02/09/2012  . Hyperlipidemia 02/09/2012    Past Surgical History:  Procedure Laterality Date  . CARDIAC CATHETERIZATION  11/29/2012  . CORONARY  ANGIOPLASTY    . ESOPHAGOGASTRODUODENOSCOPY N/A 02/20/2016   Procedure: ESOPHAGOGASTRODUODENOSCOPY (EGD);  Surgeon: Bernette Redbirdobert Buccini, MD;  [facility];  Service: Endoscopy;  Laterality: N/A;    Prior to Admission medications   Medication Sig Start Date End Date Taking? Authorizing Provider  clopidogrel (PLAVIX) 75 MG tablet Take 1 tablet (75 mg total) by mouth daily. 06/11/15   Antonieta Ibaimothy J Gollan, MD  dicyclomine (BENTYL) 20 MG tablet Take 1 tablet (20 mg total) by mouth 2 (two) times daily. 03/01/16   Joycie PeekBenjamin Cartner, PA-C  ezetimibe (ZETIA) 10 MG tablet Take 1 tablet (10 mg total) by mouth daily. 06/11/15   Antonieta Ibaimothy J Gollan, MD  methylphenidate (CONCERTA) 54 MG CR tablet Take 54 mg by mouth every morning.    Historical Provider, MD  metoprolol succinate (TOPROL-XL) 25 MG 24 hr tablet Take 1 tablet (25 mg total) by mouth daily. 02/22/16   Alison MurrayAlma M Devine, MD  pantoprazole (PROTONIX) 40 MG tablet Take 1 tablet (40 mg total) by mouth 2 (two) times daily. 02/22/16   Alison MurrayAlma M Devine, MD  polyethylene glycol [facility](MIRALAX / Ethelene HalGLYCOLAX) packet Take 17 g by mouth daily. 03/01/16   Joycie PeekBenjamin Cartner, PA-C  rosuvastatin (CRESTOR) 20 MG tablet Take 1 tablet (20 mg total) by mouth at bedtime. 02/09/12   Antonieta Ibaimothy J Gollan, MD  sucralfate (CARAFATE) 1 g tablet Take 1 tablet (1 g total) by mouth 4 (four) times daily. 02/22/16   Alison MurrayAlma M Devine, MD  Patient  reports she discontinued Plavix about 3 weeks ago due to having an ulcer.   Allergies Sulfa antibiotics and Bupropion  Family History  Problem Relation Age of Onset  . Heart attack Father 21  . Stroke Mother     Social History Social History  Substance Use Topics  . Smoking status: Never Smoker  . Smokeless tobacco: Not on file  . Alcohol use No    Review of Systems Constitutional: No fever/chills. Feels fatigue and "dehydrated" Eyes: No visual changes. ENT: No sore throat. Cardiovascular: Denies chest pain. Respiratory: Denies shortness of  breath. Gastrointestinal: No abdominal pain.  Unremitting nausea and vomiting about 6 times last 24 hours whenever he is try to keep any food down. No diarrhea.  No constipation. Genitourinary: Negative for dysuria. Musculoskeletal: Negative for back pain. Skin: Negative for rash. Neurological: Negative for headaches, focal weakness or numbness.  10-point ROS otherwise negative.  ____________________________________________   PHYSICAL EXAM:  VITAL SIGNS: ED Triage Vitals  Enc Vitals Group     BP 03/26/16 1557 137/70     Pulse Rate 03/26/16 1557 100     Resp 03/26/16 1557 18     Temp 03/26/16 1557 97.6 F (36.4 C)     Temp Source 03/26/16 1557 Oral     SpO2 03/26/16 1557 100 %     Weight 03/26/16 1557 150 lb (68 kg)     Height 03/26/16 1557 6' (1.829 m)     Head Circumference --      Peak Flow --      Pain Score 03/26/16 1558 0     Pain Loc --      Pain Edu? --      Excl. in GC? --     Constitutional: Alert and oriented. Somewhat M a seated in appearance, but fully alert pleasant, in no acute distress but holding emesis basin next to him Eyes: Conjunctivae are normal. PERRL. EOMI.  Head: Atraumatic except for a small bruise over the left maxilla, he reports is been there a couple of days as he bruises easily. No trauma or injury. Nose: No congestion/rhinnorhea. Mouth/Throat: Mucous membranes are dry.  Oropharynx non-erythematous. Neck: No stridor.   Cardiovascular: Normal rate, regular rhythm. Grossly normal heart sounds.  Good peripheral circulation. Respiratory: Normal respiratory effort.  No retractions. Lungs CTAB. Gastrointestinal: Soft and nontender. No distention. Negative Murphy. No epigastric tenderness. Meticulous abdominal exam performed, the patient reports no pain in any quadrant just nausea. No distention. No peritonitis. Musculoskeletal: No lower extremity tenderness nor edema.  Neurologic:  Normal speech and language. No gross focal neurologic deficits are  appreciated. Skin:  Skin is warm, dry and intact. No rash noted. Psychiatric: Mood and affect are normal. Speech and behavior are normal.  ____________________________________________   LABS (all labs ordered are listed, but only abnormal results are displayed)  Labs Reviewed  COMPREHENSIVE METABOLIC PANEL - Abnormal; Notable for the following:       Result Value   CO2 20 (*)    Glucose, Bld 110 (*)    BUN 24 (*)    Total Protein 8.3 (*)    All other components within normal limits  CBC - Abnormal; Notable for the following:    RBC 4.00 (*)    Hemoglobin 11.6 (*)    HCT 33.4 (*)    RDW 15.2 (*)    Platelets 520 (*)    All other components within normal limits  LIPASE, BLOOD  MAGNESIUM  TROPONIN I  URINALYSIS COMPLETEWITH MICROSCOPIC (  [facility] ONLY)   ____________________________________________  EKG  ED ECG REPORT I, Minka Knight, the attending physician, personally viewed and interpreted this ECG.  Date: 03/26/2016 EKG Time: 1555 Rate: 100 Rhythm: sinus tachycardia QRS Axis: normal Intervals: normal ST/T Wave abnormalities: normal Conduction Disturbances: none Narrative Interpretation: sinus tachycardia  ____________________________________________  RADIOLOGY  Dg Abd 2 Views  Result Date: 03/26/2016 CLINICAL DATA:  Intractable nausea and vomiting for 8 months. Hematemesis. EXAM: ABDOMEN - 2 VIEW COMPARISON:  03/01/2016 FINDINGS: Colonic distention has resolved since previous study. There is persistent moderate stool burden throughout the colon. No evidence of dilated small bowel loops. No evidence of free air. No radiopaque calculi identified. IMPRESSION: No acute findings.  Moderate colonic stool noted. Electronically Signed   By: Myles Rosenthal M.D.   On: 03/26/2016 17:32    ____________________________________________   PROCEDURES  Procedure(s) performed: None  Procedures  Critical Care performed:  No  ____________________________________________   INITIAL IMPRESSION / ASSESSMENT AND PLAN / ED COURSE  Pertinent labs & imaging results that were available during my care of the patient were reviewed by me and considered in my medical decision making (see chart for details).  Patient presents for unremitting intractable nausea at home. Reports intermittent for the last couple of months, and is been diagnosed with a duodenal ulcer. His symptoms today appear consistent with likely intractable nausea secondary to ulcer and his abdominal exam is very reassuring with no evidence support acute surgical abdomen. He reports he's had a CT scan for similar several months ago, and he is also been following up with gastroenterology appropriately on his proton pump inhibitor.  He does not have report of hematemesis or melena today, and his hemoglobin is improved. Reports in the office on Monday his hemoglobin was 10 and he's been on iron supplementation.  I suspect he likely has intractable nausea secondary to his ulcer pinched intentionally dysmotility based on previous endoscopy report. No evidence of acute surgical abdomen. I will proceed with labs, Zofran, and we discussed the risks and benefits of IV Phenergan and the patient is comfortable with receiving IV Phenergan after discussion because of his intractable symptoms.   Clinical Course  Value Comment By Time  Hemoglobin: (!) 11.6 (Reviewed) Sharyn Creamer, MD 08/13 1652  Hemoglobin: (!) 11.6 (Reviewed) Sharyn Creamer, MD 08/13 1652  Hemoglobin: (!) 11.6 (Reviewed) Sharyn Creamer, MD 08/13 1652   Case discussed with Dr. Mechele Collin, he will be available tomorrow and see the patient in the morning. Advises nothing by mouth and plan for endoscopy tomorrow. Discussed with the patient his wife are both in agreement, after Zofran, Phenergan, and Reglan the patient continues to feel nauseated and cannot eat. Suspect this is possibly due to gastric outlet type  obstruction or ongoing ulcerative disease.  Because of the patient's ongoing nausea, and suspect outlet obstruction, the recommendation of Dr. Mechele Collin we will admit the patient to hospital for evaluation, hydration, and GI consultation the morning. Patient wife both very agreeable.  ____________________________________________   FINAL CLINICAL IMPRESSION(S) / ED DIAGNOSES  Final diagnoses:  Gastric outflow obstruction  Duodenal ulcer  Nausea      NEW MEDICATIONS STARTED DURING THIS VISIT:  New Prescriptions   No medications on file     Note:  This document was prepared using Dragon voice recognition software and may include unintentional dictation errors.     Sharyn Creamer, MD 03/26/16 2044

## 2016-03-26 NOTE — ED Notes (Signed)
Pt had emesis x 1 approx 100cc

## 2016-03-26 NOTE — ED Notes (Signed)
Pt states he has lost 30lbs in just under 2 months from the gi problems he has been experiencing. He also has a bruise around his left that appear on awaking this morning with no injury.

## 2016-03-26 NOTE — ED Notes (Addendum)
Pt has hx of ulcer and has been vomiting on and off x "weeks". Worse today. Took a phenergan suppository at 10am.

## 2016-03-27 ENCOUNTER — Encounter
Admission: EM | Disposition: A | Discharge status: Other Acute Inpatient Hospital | Payer: Self-pay | Source: Home / Self Care | Attending: Internal Medicine

## 2016-03-27 ENCOUNTER — Inpatient Hospital Stay: Payer: BC Managed Care – PPO | Admitting: Anesthesiology

## 2016-03-27 DIAGNOSIS — K315 Obstruction of duodenum: Secondary | ICD-10-CM

## 2016-03-27 HISTORY — PX: ESOPHAGOGASTRODUODENOSCOPY (EGD) WITH PROPOFOL: SHX5813

## 2016-03-27 LAB — CBC
HEMATOCRIT: 28.5 % — AB (ref 40.0–52.0)
HEMOGLOBIN: 9.6 g/dL — AB (ref 13.0–18.0)
MCH: 28.4 pg (ref 26.0–34.0)
MCHC: 33.8 g/dL (ref 32.0–36.0)
MCV: 84.1 fL (ref 80.0–100.0)
Platelets: 398 10*3/uL (ref 150–440)
RBC: 3.39 MIL/uL — ABNORMAL LOW (ref 4.40–5.90)
RDW: 14.9 % — ABNORMAL HIGH (ref 11.5–14.5)
WBC: 7.3 10*3/uL (ref 3.8–10.6)

## 2016-03-27 LAB — URINALYSIS COMPLETE WITH MICROSCOPIC (ARMC ONLY)
Bacteria, UA: NONE SEEN
Bilirubin Urine: NEGATIVE
Glucose, UA: NEGATIVE mg/dL
Hgb urine dipstick: NEGATIVE
LEUKOCYTES UA: NEGATIVE
Nitrite: NEGATIVE
PH: 5 (ref 5.0–8.0)
PROTEIN: NEGATIVE mg/dL
SPECIFIC GRAVITY, URINE: 1.024 (ref 1.005–1.030)
Squamous Epithelial / LPF: NONE SEEN

## 2016-03-27 LAB — BASIC METABOLIC PANEL
ANION GAP: 5 (ref 5–15)
BUN: 22 mg/dL — ABNORMAL HIGH (ref 6–20)
CO2: 21 mmol/L — AB (ref 22–32)
Calcium: 8.2 mg/dL — ABNORMAL LOW (ref 8.9–10.3)
Chloride: 110 mmol/L (ref 101–111)
Creatinine, Ser: 0.71 mg/dL (ref 0.61–1.24)
GFR calc Af Amer: >60 mL/min (ref >60)
GFR calc non Af Amer: >60 mL/min (ref >60)
GLUCOSE: 95 mg/dL (ref 65–99)
POTASSIUM: 3.8 mmol/L (ref 3.5–5.1)
Sodium: 136 mmol/L (ref 135–145)

## 2016-03-27 SURGERY — ESOPHAGOGASTRODUODENOSCOPY (EGD) WITH PROPOFOL
Anesthesia: General

## 2016-03-27 SURGERY — EGD (ESOPHAGOGASTRODUODENOSCOPY)
Anesthesia: General

## 2016-03-27 MED ORDER — BUTAMBEN-TETRACAINE-BENZOCAINE 2-2-14 % EX AERO
INHALATION_SPRAY | CUTANEOUS | Status: DC | PRN
  Administered 2016-03-27: 1 via TOPICAL

## 2016-03-27 MED ORDER — PROPOFOL 500 MG/50ML IV EMUL
INTRAVENOUS | Status: DC | PRN
  Administered 2016-03-27: 100 ug/kg/min via INTRAVENOUS

## 2016-03-27 MED ORDER — FENTANYL CITRATE (PF) 100 MCG/2ML IJ SOLN
INTRAMUSCULAR | Status: DC | PRN
  Administered 2016-03-27: 50 ug via INTRAVENOUS

## 2016-03-27 MED ORDER — LIDOCAINE HCL (PF) 2 % IJ SOLN
INTRAMUSCULAR | Status: DC | PRN
  Administered 2016-03-27: 50 mg

## 2016-03-27 MED ORDER — PANTOPRAZOLE SODIUM 40 MG IV SOLR: 40.0000 mg | Freq: Two times a day (BID) | INTRAVENOUS | Status: DC

## 2016-03-27 MED ORDER — MIDAZOLAM HCL 5 MG/5ML IJ SOLN
INTRAMUSCULAR | Status: DC | PRN
  Administered 2016-03-27: 1 mg via INTRAVENOUS

## 2016-03-27 MED ORDER — PROPOFOL 10 MG/ML IV BOLUS
INTRAVENOUS | Status: DC | PRN
  Administered 2016-03-27: 50 mg via INTRAVENOUS

## 2016-03-27 MED ORDER — DIPHENHYDRAMINE HCL 50 MG/ML IJ SOLN
12.5000 mg | Freq: Every evening | INTRAMUSCULAR | Status: DC | PRN
  Administered 2016-03-27: 12.5 mg via INTRAVENOUS
  Filled 2016-03-27: qty 1

## 2016-03-27 NOTE — Plan of Care (Signed)
Problem: Education: Goal: Knowledge of [facility] Education information/materials will improve Outcome: Progressing PT likes to be called Joshua Horn  Past Medical History:  Diagnosis Date  . ADHD (attention deficit hyperactivity disorder)   . Coronary artery disease    s/p stent   . Depression   . Hyperlipidemia     Pt is well controlled with home medications

## 2016-03-27 NOTE — Anesthesia Preprocedure Evaluation (Addendum)
Anesthesia Evaluation  Patient identified by MRN, date of birth, ID band Patient awake    Reviewed: Allergy & Precautions, NPO status , Patient's Chart, lab work & pertinent test results, reviewed documented beta blocker date and time   Airway Mallampati: II       Dental no notable dental hx.    Pulmonary neg pulmonary ROS,    Pulmonary exam normal        Cardiovascular + CAD  Normal cardiovascular exam     Neuro/Psych PSYCHIATRIC DISORDERS Depression negative neurological ROS     GI/Hepatic Neg liver ROS, PUD,   Endo/Other  negative endocrine ROS  Renal/GU negative Renal ROS     Musculoskeletal negative musculoskeletal ROS (+)   Abdominal Normal abdominal exam  (+)   Peds negative pediatric ROS (+)  Hematology  (+) anemia ,   Anesthesia Other Findings   Reproductive/Obstetrics                            Anesthesia Physical Anesthesia Plan  ASA: III  Anesthesia Plan: General   Post-op Pain Management:    Induction: Intravenous  Airway Management Planned: Nasal Cannula  Additional Equipment:   Intra-op Plan:   Post-operative Plan:   Informed Consent: I have reviewed the patients History and Physical, chart, labs and discussed the procedure including the risks, benefits and alternatives for the proposed anesthesia with the patient or authorized representative who has indicated his/her understanding and acceptance.   Dental advisory given  Plan Discussed with: CRNA and Surgeon  Anesthesia Plan Comments:         Anesthesia Quick Evaluation

## 2016-03-27 NOTE — Progress Notes (Signed)
[employer] - [place] at [facility]   PATIENT NAME: Joshua Horn    MR#:  [id number]  DATE OF BIRTH:  28-Oct-1950  SUBJECTIVE:  CHIEF COMPLAINT:   Chief Complaint  Patient presents with  . Emesis    recent diagnosis with Duodenal ulcer, came with vomiting. Plan for EGD today. On protonix drip.  REVIEW OF SYSTEMS:  CONSTITUTIONAL: No fever, fatigue or weakness.  EYES: No blurred or double vision.  EARS, NOSE, AND THROAT: No tinnitus or ear pain.  RESPIRATORY: No cough, shortness of breath, wheezing or hemoptysis.  CARDIOVASCULAR: No chest pain, orthopnea, edema.  GASTROINTESTINAL: positive for  nausea, vomiting, no diarrhea or abdominal pain.  GENITOURINARY: No dysuria, hematuria.  ENDOCRINE: No polyuria, nocturia,  HEMATOLOGY: No anemia, easy bruising or bleeding SKIN: No rash or lesion. MUSCULOSKELETAL: No joint pain or arthritis.   NEUROLOGIC: No tingling, numbness, weakness.  PSYCHIATRY: No anxiety or depression.   ROS  DRUG ALLERGIES:   Allergies  Allergen Reactions  . Sulfa Antibiotics     rash  . Bupropion Rash    VITALS:  Blood pressure (!) 118/56, pulse 74, temperature 98.1 F (36.7 C), temperature source Oral, resp. rate 18, height 6' (1.829 m), weight 70.6 kg (155 lb 11.2 oz), SpO2 100 %.  PHYSICAL EXAMINATION:   GENERAL:  65 y.o.-year-old, ill nourished appearing patient lying in the bed with no acute distress.  EYES: Pupils equal, round, reactive to light and accommodation. No scleral icterus. Extraocular muscles intact. Bruise under left eye HEENT: Head atraumatic, normocephalic. Oropharynx and nasopharynx clear.  NECK:  Supple, no jugular venous distention. No thyroid enlargement, no tenderness.  LUNGS: Normal breath sounds bilaterally, no wheezing, rales,rhonchi or crepitation. No use of accessory muscles of respiration.  CARDIOVASCULAR: S1, S2 normal. No murmurs, rubs, or gallops.  ABDOMEN: Soft, nontender, nondistended. Bowel sounds  present. No organomegaly or mass.  EXTREMITIES: No pedal edema, cyanosis, or clubbing.  NEUROLOGIC: Cranial nerves II through XII are intact. Muscle strength 5/5 in all extremities. Sensation intact. Gait not checked.  PSYCHIATRIC: The patient is alert and oriented x 3.  SKIN: No obvious rash, lesion, or ulcer.   Physical Exam LABORATORY PANEL:   CBC  Recent Labs Lab 03/27/16 0411  WBC 7.3  HGB 9.6*  HCT 28.5*  PLT 398   ------------------------------------------------------------------------------------------------------------------  Chemistries   Recent Labs Lab 03/26/16 1542 03/27/16 0411  NA 135 136  K 4.1 3.8  CL 106 110  CO2 20* 21*  GLUCOSE 110* 95  BUN 24* 22*  CREATININE 0.85 0.71  CALCIUM 9.2 8.2*  MG 2.0  --   AST 32  --   ALT 36  --   ALKPHOS 119  --   BILITOT 0.5  --    ------------------------------------------------------------------------------------------------------------------  Cardiac Enzymes  Recent Labs Lab 03/26/16 1542  TROPONINI <0.03   ------------------------------------------------------------------------------------------------------------------  RADIOLOGY:  Dg Abd 2 Views  Result Date: 03/26/2016 CLINICAL DATA:  Intractable nausea and vomiting for 8 months. Hematemesis. EXAM: ABDOMEN - 2 VIEW COMPARISON:  03/01/2016 FINDINGS: Colonic distention has resolved since previous study. There is persistent moderate stool burden throughout the colon. No evidence of dilated small bowel loops. No evidence of free air. No radiopaque calculi identified. IMPRESSION: No acute findings.  Moderate colonic stool noted. Electronically Signed   By: Myles RosenthalJohn  Stahl M.D.   On: 03/26/2016 17:32    ASSESSMENT AND PLAN:   Active Problems:   Duodenal ulcer   #1 Intractable nausea and vomiting- ? Duodenal obstruction  from the duodenal ulcer- no mechanical obstruction based on KUB - IV fluids, zofran, IV protonix drip. - NPO and GI consult - today  repeat EGD again   #2 Duodenal ulcer- plan as above, IV protonix - carafate and NPO  #3 Hyperlipidemia- statin  #4 Anemia- hb last month at discharge around 8.5, now at 11- could be hemoconcentration from dehydration - IV fluids and close monitoring  #5 DVT Prophylaxis- TEDs and SCDs    All the records are reviewed and case discussed with Care Management/Social Workerr. Management plans discussed with the patient, family and they are in agreement.  CODE STATUS: full.  TOTAL TIME TAKING CARE OF THIS PATIENT: 35 minutes.     POSSIBLE D/C IN 1-2 DAYS, DEPENDING ON CLINICAL CONDITION.   Altamese DillingVACHHANI, Jamiere Gulas M.D on 03/27/2016   Between 7am to 6pm - Pager - [phone]  After 6pm go to [email or web address] - password [email or web address]  [facility]  Office  [phone]  CC: Primary care physician; Jaclyn ShaggyATE,DENNY C, MD  Note: This dictation was prepared with Dragon dictation along with smaller phrase technology. Any transcriptional errors that result from this process are unintentional.

## 2016-03-27 NOTE — Consult Note (Signed)
GI Inpatient Consult Note  Reason for Consult:abdominal pain, hematemesis, weight loss,    Attending Requesting Consult:Dr Kalisetti  History of Present Illness: Joshua Horn is a 65 y.o. male who had EGD 6 weeks ago with large duodenal ulcer.  Since then he has had steady weight loss, abdominal pain, nausea, vomiting.  Patient with long hx of NSAID use at very high doses.  Past Medical History:  Past Medical History:  Diagnosis Date  . ADHD (attention deficit hyperactivity disorder)   . Coronary artery disease    s/p stent   . Depression   . Hyperlipidemia     Problem List: Patient Active Problem List   Diagnosis Date Noted  . Duodenal ulcer 03/26/2016  . Upper GI bleed 02/20/2016  . Acute blood loss anemia 02/20/2016  . Chest pain 11/22/2012  . CAD (coronary artery disease) 02/09/2012  . Essential hypertension 02/09/2012  . Hyperlipidemia 02/09/2012    Past Surgical History: Past Surgical History:  Procedure Laterality Date  . CARDIAC CATHETERIZATION  11/29/2012  . CORONARY ANGIOPLASTY    . ESOPHAGOGASTRODUODENOSCOPY N/A 02/20/2016   Procedure: ESOPHAGOGASTRODUODENOSCOPY (EGD);  Surgeon: Bernette Redbirdobert Buccini, MD;  [facility];  Service: Endoscopy;  Laterality: N/A;    Allergies: Allergies  Allergen Reactions  . Sulfa Antibiotics     rash  . Bupropion Rash    Home Medications: Prescriptions Prior to Admission  Medication Sig Dispense Refill Last Dose  . clopidogrel (PLAVIX) 75 MG tablet Take 1 tablet (75 mg total) by mouth daily. 30 tablet 8 03/26/2016 at Unknown time  . ezetimibe (ZETIA) 10 MG tablet Take 1 tablet (10 mg total) by mouth daily. 30 tablet 8 03/26/2016 at Unknown time  . FLUoxetine (PROZAC) 20 MG capsule Take 20 mg by mouth 2 (two) times daily.   03/26/2016 at Unknown time  . methylphenidate (CONCERTA) 54 MG CR tablet Take 54 mg by mouth every morning.   03/26/2016 at Unknown time  . metoprolol succinate (TOPROL-XL) 25 MG 24 hr tablet Take 1  tablet (25 mg total) by mouth daily. 30 tablet 8 03/26/2016 at Unknown time  . pantoprazole (PROTONIX) 40 MG tablet Take 1 tablet (40 mg total) by mouth 2 (two) times daily. 60 tablet 2 03/26/2016 at Unknown time  . rosuvastatin (CRESTOR) 20 MG tablet Take 1 tablet (20 mg total) by mouth at bedtime. 30 tablet 11 03/25/2016 at Unknown time  . polyethylene glycol (MIRALAX / GLYCOLAX) packet Take 17 g by mouth daily. 14 each 0    Home medication reconciliation was completed with the patient.   Scheduled Inpatient Medications:   . dicyclomine  20 mg Oral BID  . ezetimibe  10 mg Oral Daily  . methylphenidate  54 mg Oral [id number]  . metoprolol succinate  25 mg Oral Daily  . [START ON 03/30/2016] pantoprazole  40 mg Intravenous Q12H  . polyethylene glycol  17 g Oral Daily  . rosuvastatin  20 mg Oral QHS  . sucralfate  1 g Oral QID    Continuous Inpatient Infusions:   . sodium chloride 60 mL/hr at 03/26/16 2322  . pantoprozole (PROTONIX) infusion      PRN Inpatient Medications:  acetaminophen **OR** acetaminophen, ondansetron **OR** ondansetron (ZOFRAN) IV  Family History: family history includes Heart attack (age of onset: 7858) in his father; Stroke in his mother.  The patient's family history is negative for inflammatory bowel disorders, GI malignancy, or solid organ transplantation.  Social History:   reports that he has never smoked. He  does not have any smokeless tobacco history on file. He reports that he does not drink alcohol or use drugs. The patient denies ETOH, tobacco, or drug use.   Review of Systems: Constitutional: Losing weight maybe 20-30 lbs in few months.  Eyes: No changes in vision. ENT: No oral lesions, sore throat.  GI: see HPI. Vomiting, hematemesis, weight loss, abd pain. Heme/Lymph: No easy bruising.  CV: No chest pain.  GU: No hematuria.  Integumentary: No rashes.  Neuro: No headaches.  Psych: No depression/anxiety.  Endocrine: No heat/cold intolerance.   Allergic/Immunologic: No urticaria.  Resp: No cough, SOB.  Musculoskeletal: No joint swelling.    Physical Examination: BP 134/62 (BP Location: Right Arm)   Pulse 81   Temp 98 F (36.7 C) (Oral)   Resp 18   Ht 6' (1.829 m)   Wt 70.6 kg (155 lb 11.2 oz)   SpO2 97%   BMI 21.12 kg/m  Gen: NAD, alert and oriented x 4 HEENT: PEERLA, EOMI, Neck: supple, no JVD or thyromegaly Chest: CTA bilaterally, no wheezes, crackles, or other adventitious sounds CV: RRR, no m/g/c/r Abd: soft, NT, ND, +BS in all four quadrants; no HSM, there is palpable tenderness. Ext: no edema, well perfused with 2+ pulses, Skin: no rash or lesions noted Lymph: no LAD  Data: Lab Results  Component Value Date   WBC 7.3 03/27/2016   HGB 9.6 (L) 03/27/2016   HCT 28.5 (L) 03/27/2016   MCV 84.1 03/27/2016   PLT 398 03/27/2016    Recent Labs Lab 03/26/16 1542 03/27/16 0411  HGB 11.6* 9.6*   Lab Results  Component Value Date   NA 136 03/27/2016   K 3.8 03/27/2016   CL 110 03/27/2016   CO2 21 (L) 03/27/2016   BUN 22 (H) 03/27/2016   CREATININE 0.71 03/27/2016   Lab Results  Component Value Date   ALT 36 03/26/2016   AST 32 03/26/2016   ALKPHOS 119 03/26/2016   BILITOT 0.5 03/26/2016   No results for input(s): APTT, INR, PTT in the last 168 hours. Assessment/Plan: Joshua Horn is a 65 y.o. male with recent GI bleed and large ulcer in duodenum.  Weight loss, hematemesis.  Recommendations:EGD and surgical consult.  Dr. Birdie SonsByrnette notified  Thank you for the consult. Please call with questions or concerns.  Lynnae PrudeELLIOTT, Ammiel Guiney, MD

## 2016-03-27 NOTE — Consult Note (Signed)
Consultation  Referring Provider: Dr. Sharyn Creamer Primary Care Physician:  Jaclyn Shaggy, MD Consulting  Gastroenterologist:      Dr. Lynnae Prude   Reason for Consultation:   Abdominal pain, hematemesis, weight loss          HPI:   Joshua Horn is a 65 y.o. male with a known history of ADHD, CAD status post stent in 2010, depression and hyperlipidemia, presents to hospital secondary to intractable nausea and vomiting that started last night. He reports chronic ibuprofen use for many years because of headache. Plavix use for remote stent.   Onset of upper stomach discomfort over the last 2 years that has become progressive and more continuous over the last 8 months.  He has had periods of nausea and vomiting for months.  He did develop hematemesis and underwent an upper endoscopy 02/20/2016 showing a large duodenal ulcer, erosive gastropathy, and evidence of functional (not anatomic) gastric outlet obstruction. No bleeding, no blood present, patient's lesions do not appear to be at high risk for re bleeding.  He was advised to stop taking his NSAIDs last month which he has done.  His HA resolved. He was discharged on Protonix 40 mg twice daily and Carafate twice daily.He has been eating a low residue diet.    He was able to take the Carafate for about 2 weeks when he had a stop it due to significant constipation.  He has remained on the Protonix but his dose was decreased to once daily when he saw his gastroenterologist on Monday.   He has continued to have problems upper abdominal pain and he feels now periods of sharp pain that radiates into the mid sternal area lasting a few seconds associated with a heartburn feeling.  He thinks he may be having spasms. He feels food get caught up in the mid xyphoid region  and he hears gurgling noises, followed by pressure and vomiting. He reports unintentional 30 pound weight loss over the last 2 months. He denies ETOH, smoking.     He started vomiting on  Saturday a large amount of food.  On Sunday he vomited 3 times, no blood.  Last night he had emesis in the dark and did not see the color.  He did see blood in vomit and dark stools last month. Last BM was Saturday- no melena.  He came in last night with a hemoglobin of 11.6 that has decreased to 9.6 this morning.  BUN was 24.   Abdomen film exam showed mild to moderate colonic stool. He was given Miralax today. He will have an upper endoscopy this afternoon to evaluate for gastric outlet obstruction and there has been placed a surgical consult with Dr. Lemar Livings as well.  Patient is resting comfortably in bed at this time.  He says he really has not eaten anything in 2 days. No prior screening colonoscopy.   Past Medical History:  Diagnosis Date  . ADHD (attention deficit hyperactivity disorder)   . Coronary artery disease    s/p stent   . Depression   . Hyperlipidemia     Past Surgical History:  Procedure Laterality Date  . CARDIAC CATHETERIZATION  11/29/2012  . CORONARY ANGIOPLASTY    . ESOPHAGOGASTRODUODENOSCOPY N/A 02/20/2016   Procedure: ESOPHAGOGASTRODUODENOSCOPY (EGD);  Surgeon: Bernette Redbird, MD;  Location: [facility] ENDOSCOPY;  Service: Endoscopy;  Laterality: N/A;    Family History  Problem Relation Age of Onset  . Heart attack Father 45  . Stroke Mother  Social History  Substance Use Topics  . Smoking status: Never Smoker  . Smokeless tobacco: Not on file  . Alcohol use No    Prior to Admission medications   Medication Sig Start Date End Date Taking? Authorizing Provider  clopidogrel (PLAVIX) 75 MG tablet Take 1 tablet (75 mg total) by mouth daily. 06/11/15  Yes Antonieta Ibaimothy J Gollan, MD  ezetimibe (ZETIA) 10 MG tablet Take 1 tablet (10 mg total) by mouth daily. 06/11/15  Yes Antonieta Ibaimothy J Gollan, MD  FLUoxetine (PROZAC) 20 MG capsule Take 20 mg by mouth 2 (two) times daily.   Yes Historical Provider, MD  methylphenidate (CONCERTA) 54 MG CR tablet Take 54 mg by mouth every  morning.   Yes Historical Provider, MD  metoprolol succinate (TOPROL-XL) 25 MG 24 hr tablet Take 1 tablet (25 mg total) by mouth daily. 02/22/16  Yes Alison MurrayAlma M Devine, MD  pantoprazole (PROTONIX) 40 MG tablet Take 1 tablet (40 mg total) by mouth 2 (two) times daily. 02/22/16  Yes Alison MurrayAlma M Devine, MD  rosuvastatin (CRESTOR) 20 MG tablet Take 1 tablet (20 mg total) by mouth at bedtime. 02/09/12  Yes Antonieta Ibaimothy J Gollan, MD  polyethylene glycol (MIRALAX / GLYCOLAX) packet Take 17 g by mouth daily. 03/01/16   Joycie PeekBenjamin Cartner, PA-C    Current Facility-Administered Medications  Medication Dose Route Frequency Provider Last Rate Last Dose  . 0.9 %  sodium chloride infusion   Intravenous Continuous Enid Baasadhika Kalisetti, MD 60 mL/hr at 03/26/16 2322    . acetaminophen (TYLENOL) tablet 650 mg  650 mg Oral Q6H PRN Enid Baasadhika Kalisetti, MD   650 mg at 03/27/16 0942   Or  . acetaminophen (TYLENOL) suppository 650 mg  650 mg Rectal Q6H PRN Enid Baasadhika Kalisetti, MD      . dicyclomine (BENTYL) capsule 20 mg  20 mg Oral BID Enid Baasadhika Kalisetti, MD   20 mg at 03/27/16 0934  . ezetimibe (ZETIA) tablet 10 mg  10 mg Oral Daily Enid Baasadhika Kalisetti, MD   10 mg at 03/27/16 0934  . methylphenidate (CONCERTA) CR tablet 54 mg  54 mg Oral Garen LahBH-q7a Radhika Kalisetti, MD      . metoprolol succinate (TOPROL-XL) 24 hr tablet 25 mg  25 mg Oral Daily Enid Baasadhika Kalisetti, MD   25 mg at 03/27/16 0934  . ondansetron (ZOFRAN) tablet 4 mg  4 mg Oral Q6H PRN Enid Baasadhika Kalisetti, MD       Or  . ondansetron (ZOFRAN) injection 4 mg  4 mg Intravenous Q6H PRN Enid Baasadhika Kalisetti, MD   4 mg at 03/26/16 2346  . pantoprazole (PROTONIX) 80 mg in sodium chloride 0.9 % 250 mL (0.32 mg/mL) infusion  8 mg/hr Intravenous Continuous Enid Baasadhika Kalisetti, MD      . Melene Muller[START ON 03/30/2016] pantoprazole (PROTONIX) injection 40 mg  40 mg Intravenous Q12H Altamese DillingVaibhavkumar Vachhani, MD      . polyethylene glycol (MIRALAX / GLYCOLAX) packet 17 g  17 g Oral Daily Enid Baasadhika Kalisetti, MD   17 g at  03/27/16 0935  . rosuvastatin (CRESTOR) tablet 20 mg  20 mg Oral QHS Enid Baasadhika Kalisetti, MD      . sucralfate (CARAFATE) tablet 1 g  1 g Oral QID Enid Baasadhika Kalisetti, MD   1 g at 03/27/16 0934    Allergies as of 03/26/2016 - Review Complete 03/26/2016  Allergen Reaction Noted  . Sulfa antibiotics  02/09/2012  . Bupropion Rash 02/19/2016     Review of Systems:    A 12 system review was obtained and all  negative except where noted in HPI.    Physical Exam:  Vital signs in last 24 hours: Temp:  [97.6 F (36.4 C)-98.7 F (37.1 C)] 98 F (36.7 C) (08/14 1229) Pulse Rate:  [78-100] 81 (08/14 1229) Resp:  [16-18] 18 (08/14 1229) BP: (125-153)/(62-83) 134/62 (08/14 1229) SpO2:  [95 %-100 %] 97 % (08/14 1229) Weight:  [68 kg (150 lb)-70.6 kg (155 lb 11.2 oz)] 70.6 kg (155 lb 11.2 oz) (08/13 2236) Last BM Date: 03/26/16  General:  Well-developed, well-nourished and in no acute distress. Slim  Head:  Head without obvious abnormality, atraumatic  Eyes:   Conjunctiva pink, sclera anicteric   ENT:   Mouth free of lesions, mucosa moist, tongue pink, no thrush noted, teeth and gums normal Neck:   Supple w/o thyromegaly or mass, trachea midline, no adenopathy  Lungs: Clear to auscultation bilaterally, respirations unlabored Heart:     Normal S1S2, no rubs, murmurs, gallops. Abdomen: Soft, flat, non-tender, no hepatosplenomegaly, hernia, or mass and BS normal Rectal: Deferred Lymph:  No cervical or supraclavicular adenopathy. Extremities:   No edema, cyanosis, or clubbing Skin  Skin color, texture, turgor normal, no rashes or lesions Neuro:  A&O x 3. CNII-XII intact, normal strength Psych:  Appropriate mood and affect.  Data Reviewed:  LAB RESULTS:  Recent Labs  03/26/16 1542 03/27/16 0411  WBC 10.2 7.3  HGB 11.6* 9.6*  HCT 33.4* 28.5*  PLT 520* 398   BMET  Recent Labs  03/26/16 1542 03/27/16 0411  NA 135 136  K 4.1 3.8  CL 106 110  CO2 20* 21*  GLUCOSE 110* 95  BUN 24*  22*  CREATININE 0.85 0.71  CALCIUM 9.2 8.2*   LFT  Recent Labs  03/26/16 1542  PROT 8.3*  ALBUMIN 4.0  AST 32  ALT 36  ALKPHOS 119  BILITOT 0.5   PT/INR No results for input(s): LABPROT, INR in the last 72 hours.  STUDIES: Dg Abd 2 Views  Result Date: 03/26/2016 CLINICAL DATA:  Intractable nausea and vomiting for 8 months. Hematemesis. EXAM: ABDOMEN - 2 VIEW COMPARISON:  03/01/2016 FINDINGS: Colonic distention has resolved since previous study. There is persistent moderate stool burden throughout the colon. No evidence of dilated small bowel loops. No evidence of free air. No radiopaque calculi identified. IMPRESSION: No acute findings.  Moderate colonic stool noted. Electronically Signed   By: Myles Rosenthal M.D.   On: 03/26/2016 17:32    Assessment:  Joshua Horn is a 65 y.o. with a known history of ADHD, CAD status post stent in 2010, depression and hyperlipidemia, presents to hospital secondary to intractable nausea and vomiting that started last night. He has unintentional weight loss, previous melena, and likely has UGI bleed with known large duodenal ulcer and gastric outlet obstruction.  2. Acute blood loss  anemia 3. Need for eventual colonoscopy once ulcer is healed 4. Constipation 5. CAD with history of 3 cardiac catheterizations 2010 with stent, 2012, 2014 on Plavix followed by Dr. Mariah Milling. No chest pain, some fatigue, no DOE, troponin negative.   Plan:  Continue with IV Protonix. EGD this afternoon with biopsy for  H pylori. Surgical consult pending to evaluate gastric obstruction.  Monitor serial Hgb  Counseled to remain off NSAIDs lifelong. He voices understanding   This case was discussed with Dr. Scot Jun in collaboration of care. Thank you for the consultation.  These services provided by Amedeo Kinsman RN, MSN, ANP-BC under collaborative practice agreement with Scot Jun, MD.  03/27/2016, 12:50 PM

## 2016-03-27 NOTE — Op Note (Signed)
[facility] Gastroenterology Patient Name: Joshua SmolderSteven Dao Procedure Date: 03/27/2016 2:25 PM MRN: [id number] Account #: [id number] Date of Birth: Jul 31, 1951 Admit Type: Inpatient Age: [age] [facility] ROOM 1 Gender: Male Note Status: Finalized Procedure:            Upper GI endoscopy Indications:          Epigastric abdominal pain, Nausea with vomiting Providers:            Scot Junobert T. Elliott, MD Referring MD:         Jillene Bucksenny C. Arlana Pouchate, MD (Referring MD) Medicines:            Propofol per Anesthesia Complications:        No immediate complications. Procedure:            Pre-Anesthesia Assessment:                       - After reviewing the risks and benefits, the patient                        was deemed in satisfactory condition to undergo the                        procedure.                       After obtaining informed consent, the endoscope was                        passed under direct vision. Throughout the procedure,                        the patient's blood pressure, pulse, and oxygen                        saturations were monitored continuously. The Endoscope                        was introduced through the mouth, and advanced to the                        duodenal bulb. The upper GI endoscopy was accomplished                        without difficulty. The patient tolerated the procedure                        well. Findings:      The examined esophagus was normal.      Excessive fluid was found in the stomach. Fluid aspiration for cytology       was performed. 825cc removed was bilious. No blood. Some food particles       noted.      One non-bleeding cratered gastric ulcer with no stigmata of bleeding was       found in the prepyloric region of the stomach. The lesion was 20 mm in       largest dimension.      One non-bleeding cratered duodenal ulcer with no stigmata of bleeding       was found in the duodenal bulb. This was causing outlet  obstruction. The       lesion was 3  mm in largest dimension. About 30-50cc suctioned out of the       duodenal bulb. No blood in stomach or duodenum. Impression:           - Normal esophagus.                       - Excessive gastric fluid. Fluid aspiration performed.                       - Non-bleeding gastric ulcer with no stigmata of                        bleeding.                       - One non-bleeding duodenal ulcer with no stigmata of                        bleeding. Recommendation:       - The findings and recommendations were discussed with                        the surgeon. Dr. Birdie SonsByrnette. will get an UGI tomorrow                        with thin barium. Scot Junobert T Elliott, MD 03/27/2016 2:56:07 PM This report has been signed electronically. Number of Addenda: 0 Note Initiated On: 03/27/2016 2:25 PM      [facility]

## 2016-03-27 NOTE — Progress Notes (Signed)
Initial Nutrition Assessment  DOCUMENTATION CODES:   Not applicable  INTERVENTION:   -RD will follow for diet advancement and supplement as appropriate  NUTRITION DIAGNOSIS:   Inadequate oral intake related to altered GI function as evidenced by NPO status.  GOAL:   Patient will meet greater than or equal to 90% of their needs  MONITOR:   Diet advancement, Labs, Weight trends, Skin, I & O's  REASON FOR ASSESSMENT:   Malnutrition Screening Tool    ASSESSMENT:   Joshua Horn  is a 65 y.o. male with a known history of ADHD, CAD status post stent in 2010, depression and hyperlipidemia presents to hospital secondary to intractable nausea and vomiting that started last night.  Pt admitted with intractable nausea and vomiting. Of note, pt with duodenal ulcer, however, KUB reveals no mechanical obstruction.   Pt down in endoscopy at time of visit. No family present to provide hx. Per MD notes, pt will receive EGD to rule out gastric outlet obstruction. Surgical consult pending.   Wt hx reviewed. Noted UBW is around 185-190#. Noted pt has experienced a 25# (13.9%) wt loss over the past 6 months, which is significant for time frame. Pt has had poor oral intake secondary to nausea and vomiting and upper abdominal pain for over the past 2 months. Per GI notes, pt was consuming a low fiber diet over the past 2 months.   Unable to complete Nutrition-Focused physical exam at this time.   RD suspects that pt may have some degree of malnutrition, given poor po intake, significant weight loss, and altered GI function.  Labs reviewed.   Diet Order:  Diet NPO time specified  Skin:  Reviewed, no issues  Last BM:  03/26/16  Height:   Ht Readings from Last 1 Encounters:  03/26/16 6' (1.829 m)    Weight:   Wt Readings from Last 1 Encounters:  03/26/16 155 lb 11.2 oz (70.6 kg)    Ideal Body Weight:  80.9 kg  BMI:  Body mass index is 21.12 kg/m.  Estimated Nutritional Needs:    Kcal:  1900-2100  Protein:  100-115 grams  Fluid:  1.9-2.1 L  EDUCATION NEEDS:   No education needs identified at this time  Joshua Horn, RD, LDN, CDE Pager: [phone] After hours Pager: [phone]

## 2016-03-27 NOTE — Transfer of Care (Signed)
Immediate Anesthesia Transfer of Care Note  Patient: Joshua BonitoSteven Davis [name]  Procedure(s) Performed: Procedure(s): ESOPHAGOGASTRODUODENOSCOPY (EGD) WITH PROPOFOL (N/A)  Patient Location: PACU  Anesthesia Type:General  Level of Consciousness: sedated  Airway & Oxygen Therapy: Patient Spontanous Breathing and Patient connected to nasal cannula oxygen  Post-op Assessment: Report given to RN and Post -op Vital signs reviewed and stable  Post vital signs: Reviewed and stable  Last Vitals:  Vitals:   03/27/16 0441 03/27/16 1229  BP: 125/63 134/62  Pulse: 78 81  Resp: 18 18  Temp: 37.1 C 36.7 C    Last Pain:  Vitals:   03/27/16 1229  TempSrc: Oral  PainSc:          Complications: No apparent anesthesia complications

## 2016-03-27 NOTE — Consult Note (Signed)
Joshua BonitoSteven Davis Judy [id number] 1951/02/23     HPI: The 65 year old male is admitted with recurrent nausea and vomiting. Past history is been well documented in notes from the admitting physician as well as the GI consultant.  In discussion with the patient this evening and his wife, he reports that he was initially usual state of health until about 2 years ago when he began to develop intermittent diarrhea. This was not clearly related to any food it was not so much as a problem as to cause him to present for medical assessment. His wife reports that he is one not to complain. Begin about a year ago he had episodic upper abdominal pain that was common go without any clear pattern. During this time he did not note any change in his diet or activity. Upper and until year ago he was biking aggressively including 100 mile bike rides without any difficulty. Episodes of pain became more frequent and in February of this year were severe enough as to cause him to present to the hospital for assessment. While he describes the pain as being in the upper abdomen the CT request suggested right lower quadrant pain as the source. In any event, no evidence of a pathologic process was identified and he was discharged home. Since that time he has had intermittent episodes of vomiting, usually of small volume not enough again to have him seek medical attention. Last month he was admitted with an episode of hematemesis that was massive enough to "trip his trigger" to be evaluated. He admitted that he had small volumes of emesis, blood-tinged, prior to the massive episode. He was cared for in [place] and upper endoscopy at that time showed a 30 mm ulcer in the duodenal bulb, mild gastritis and biopsies negative for H. pylori. He had no further bleeding, hemoglobin stabilized above 8 and he was discharged home. Up until February of this year, his baseline weight at [facility] and around 175 pounds. He lost small amount of weight  between February and July, but since his July 2017 hospitalization he has lost significant amount of weight estimated to be about 25 pounds. This is about 13% of his body weight. The patient reported that he tried to make use of Ensure, but he only spelled overly full and had several episodes of vomiting precluding its ongoing use. Did have a sensitive always being "full". He had intractable nausea and vomiting S day prompting admission. I was present during his upper endoscopy earlier today and had a chance to see the large duodenal ulcer and the narrowing in the distal portion of the bulb. The endoscopist it aspirated over a liter of fluid from the stomach.  Patient's general health has been good. He had a cardiac stent placed in 2010. He had a stress test done due to a family history, this was abnormal resulting in a catheterization and stent placement. He is had no cardiac symptoms since that time (in retrospect he admitted he had had some episodic discomfort prior to stent placement, and he has had a few transient episodes which were worked up by his cardiologist, Julien Nordmannimothy Gollan, M.D. without evidence of new high-grade lesions.  Patient had been maintained on Plavix up until his present admission.  The patient reported he gave up drinking 20 years ago as he found the takes afterwards run acceptable. He does not smoke.  He has a significant history of headaches dating back to about age 65. He reports seeing perhaps 1 or 2 neurologist  in his life and never had a satisfactory answer. He found the side effects from Imitrex unsuccessful and he found he was getting equally good results with high-dose ibuprofen. This was terminated after his July 2017 admission with the identification of the large ulcer.  The patient is a Education officer, communitydentist. [place] previously had a [place] but more recently has gone to work in the state [employer] [place].  Prescriptions Prior to Admission  Medication Sig Dispense  Refill Last Dose  . clopidogrel (PLAVIX) 75 MG tablet Take 1 tablet (75 mg total) by mouth daily. 30 tablet 8 03/26/2016 at Unknown time  . ezetimibe (ZETIA) 10 MG tablet Take 1 tablet (10 mg total) by mouth daily. 30 tablet 8 03/26/2016 at Unknown time  . FLUoxetine (PROZAC) 20 MG capsule Take 20 mg by mouth 2 (two) times daily.   03/26/2016 at Unknown time  . methylphenidate (CONCERTA) 54 MG CR tablet Take 54 mg by mouth every morning.   03/26/2016 at Unknown time  . metoprolol succinate (TOPROL-XL) 25 MG 24 hr tablet Take 1 tablet (25 mg total) by mouth daily. 30 tablet 8 03/26/2016 at Unknown time  . pantoprazole (PROTONIX) 40 MG tablet Take 1 tablet (40 mg total) by mouth 2 (two) times daily. 60 tablet 2 03/26/2016 at Unknown time  . rosuvastatin (CRESTOR) 20 MG tablet Take 1 tablet (20 mg total) by mouth at bedtime. 30 tablet 11 03/25/2016 at Unknown time  . polyethylene glycol (MIRALAX / GLYCOLAX) packet Take 17 g by mouth daily. 14 each 0    Allergies  Allergen Reactions  . Sulfa Antibiotics     rash  . Bupropion Rash   Past Medical History:  Diagnosis Date  . ADHD (attention deficit hyperactivity disorder)   . Coronary artery disease    s/p stent   . Depression   . Hyperlipidemia    Past Surgical History:  Procedure Laterality Date  . CARDIAC CATHETERIZATION  11/29/2012  . CORONARY ANGIOPLASTY    . ESOPHAGOGASTRODUODENOSCOPY N/A 02/20/2016   Procedure: ESOPHAGOGASTRODUODENOSCOPY (EGD);  Surgeon: Bernette Redbirdobert Buccini, MD;  [facility];  Service: Endoscopy;  Laterality: N/A;   Social History   Social History  . Marital status: Married    Spouse name: N/A  . Number of children: N/A  . Years of education: N/A   Occupational History  . Not on file.   Social History Main Topics  . Smoking status: Never Smoker  . Smokeless tobacco: Never Used  . Alcohol use No  . Drug use: No  . Sexual activity: Not on file   Other Topics Concern  . Not on file   Social History  Narrative  . No narrative on file   Social History   Social History Narrative  . No narrative on file     ROS: Nausea and vomiting as noted above.  Weight loss.  No shortness of breath or chest pain.     PE: HEENT: Negative. Lungs: Clear. Cardio: RR. Abdomen: Normal bowel sounds, nontender, nondistended.  Laboratory/imaging data:  CT scan of February 2017 was reviewed. Noncontrast study for "stone". The patient has extensive posterior calcification of the distal aorta and proximal iliacs. Minimal disease in the proximal SMA. Focal sigmoid wall thickening thought secondary to poor distention. No intra-abdominal masses or lymphadenopathy. No specific dilatation of the stomach is appreciated.  Plain films obtained in July 2017 when he presented with "constipation" showed a mass lesion distended stomach. Confirmed on yesterday's films.  Patient's hemoglobin on  admission yesterday was 11.6, 9.6 on rehydration. At discharge from the [facility] hospitalization his hemoglobin was 9.0. Normal NCV. Normal platelet count. Normal white blood cell count.  Conference the metabolic panel obtained on admission reported an albumin of 4.0. At time of his hospitalization in [place] he was admitted with a hemoglobin of 3.4 and at the time of outpatient assessment for constipation on July 19 his hemoglobin was 2.7. Normal liver function studies. Electrolytes essentially normal except for low CO2. 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  Upper endoscopy results from July 2017 completed by Loistine Chance M.D. reviewed.  Biopsy of the gastric antrum was negative for H. pylori.  I was present during his upper endoscopy today.  Assessment:  Greater than 10% weight loss secondary to partial duodenal obstruction secondary to ulcer. Unclear at this time whether this is a edema versus fixed scarring. And the scope could not be advanced into the second portion of the  duodenum today. The area was not traversed at the time of his July 2017 endoscopy.  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    Plan:  Upper GI will be obtained tomorrow to determine if the patient has complete obstruction or incomplete obstruction. If incomplete he would be a candidate for oral nutritional supplements to help replenish his protein stores prior to elective gastric resection or resolution of the edema making resection unnecessary.  If the patient has complete obstruction of the duodenum he might be a candidate for short-term intravenous nutritional supplementation prior to surgical resection and placement of an enteral feeding port at the time of resectional surgery.  He is presently on a PPI drip and this is appropriate. We'll reassess after his upcoming upper GI series is completed tomorrow.  I've spoken with Dr. Mechele Collin as well as the patient and his wife in regards to his present treatment plan. Earline Mayotte 03/27/2016

## 2016-03-27 NOTE — Progress Notes (Signed)
Pt to endo at this time for egd  Consent signed. A/o.  No resp distress. Wife at bedside.

## 2016-03-27 NOTE — Plan of Care (Signed)
Problem: Nutrition: Goal: Adequate nutrition will be maintained Outcome: Not Progressing Pt npo still. Having egd today.

## 2016-03-28 ENCOUNTER — Inpatient Hospital Stay: Payer: BC Managed Care – PPO

## 2016-03-28 ENCOUNTER — Encounter: Payer: Self-pay | Admitting: Unknown Physician Specialty

## 2016-03-28 LAB — BASIC METABOLIC PANEL WITH GFR
Anion gap: 7 (ref 5–15)
BUN: 19 mg/dL (ref 6–20)
CO2: 20 mmol/L — ABNORMAL LOW (ref 22–32)
Calcium: 8.2 mg/dL — ABNORMAL LOW (ref 8.9–10.3)
Chloride: 109 mmol/L (ref 101–111)
Creatinine, Ser: 0.6 mg/dL — ABNORMAL LOW (ref 0.61–1.24)
GFR calc Af Amer: >60 mL/min
GFR calc non Af Amer: >60 mL/min
Glucose, Bld: 75 mg/dL (ref 65–99)
Potassium: 3.6 mmol/L (ref 3.5–5.1)
Sodium: 136 mmol/L (ref 135–145)

## 2016-03-28 LAB — CBC
HEMATOCRIT: 27.3 % — AB (ref 40.0–52.0)
HEMOGLOBIN: 9.1 g/dL — AB (ref 13.0–18.0)
MCH: 28.1 pg (ref 26.0–34.0)
MCHC: 33.4 g/dL (ref 32.0–36.0)
MCV: 84.3 fL (ref 80.0–100.0)
Platelets: 349 10*3/uL (ref 150–440)
RBC: 3.24 MIL/uL — ABNORMAL LOW (ref 4.40–5.90)
RDW: 15.4 % — ABNORMAL HIGH (ref 11.5–14.5)
WBC: 7 10*3/uL (ref 3.8–10.6)

## 2016-03-28 MED ORDER — PANTOPRAZOLE SODIUM 40 MG IV SOLR: 40.0000 mg | Freq: Two times a day (BID) | INTRAVENOUS | 0 refills | Status: DC

## 2016-03-28 MED ORDER — METOCLOPRAMIDE HCL 5 MG/ML IJ SOLN
5.0000 mg | Freq: Four times a day (QID) | INTRAMUSCULAR | Status: DC | PRN
  Administered 2016-03-28: 5 mg via INTRAVENOUS
  Filled 2016-03-28: qty 2

## 2016-03-28 MED ORDER — METOCLOPRAMIDE HCL 5 MG/ML IJ SOLN: 5.0000 mg | Freq: Four times a day (QID) | INTRAMUSCULAR | 0 refills | Status: DC | PRN

## 2016-03-28 MED ORDER — OXYCODONE-ACETAMINOPHEN 5-325 MG PO TABS
1.0000 | ORAL_TABLET | Freq: Four times a day (QID) | ORAL | Status: DC | PRN
  Filled 2016-03-28: qty 1

## 2016-03-28 MED ORDER — ONDANSETRON HCL 4 MG/2ML IJ SOLN: 4.0000 mg | Freq: Four times a day (QID) | INTRAMUSCULAR | 0 refills | Status: DC | PRN

## 2016-03-28 MED ORDER — MORPHINE SULFATE (PF) 2 MG/ML IV SOLN
2.0000 mg | Freq: Once | INTRAVENOUS | Status: AC
  Administered 2016-03-28: 2 mg via INTRAVENOUS
  Filled 2016-03-28: qty 1

## 2016-03-28 NOTE — Anesthesia Postprocedure Evaluation (Signed)
Anesthesia Post Note  Patient: Joshua Horn  Procedure(s) Performed: Procedure(s) (LRB): ESOPHAGOGASTRODUODENOSCOPY (EGD) WITH PROPOFOL (N/A)  Patient location during evaluation: Endoscopy Anesthesia Type: General Level of consciousness: awake and alert Pain management: pain level controlled Vital Signs Assessment: post-procedure vital signs reviewed and stable Respiratory status: spontaneous breathing, nonlabored ventilation, respiratory function stable and patient connected to nasal cannula oxygen Cardiovascular status: blood pressure returned to baseline and stable Postop Assessment: no signs of nausea or vomiting Anesthetic complications: no    Last Vitals:  Vitals:   03/28/16 0444 03/28/16 1317  BP: (!) 115/57 117/63  Pulse: 69 76  Resp:  18  Temp: 37.3 C 36.8 C    Last Pain:  Vitals:   03/28/16 1317  TempSrc: Oral  PainSc:                  Lenard SimmerAndrew Shamira Toutant

## 2016-03-28 NOTE — Progress Notes (Signed)
The upper GI completed earlier this morning was reviewed with the attending radiologist. 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 This is an apple core lesion on GI series, and on past review of the February 2017 CT scan he show dilatation of the second portion of the duodenum and a normal third portion without a definitive mass.  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 We had a long discussion of whether surgery should be completed here, recognizing if cancer was identified (likely less than 2% risk) I would not do a pancreatic resection or he would be referred to a tertiary center.At present, [facility].   Will touch base w/ Leona Carryimothy Farrell, MD, Foregut specialist and work on transfer from there.

## 2016-03-28 NOTE — Plan of Care (Signed)
Problem: Fluid Volume: Goal: Ability to maintain a balanced intake and output will improve Outcome: Progressing protonix drip cont  Problem: Nutrition: Goal: Adequate nutrition will be maintained Outcome: Not Progressing Pt npo. Will be transferred to [facility]  For poss surgery when bed available. A/o.

## 2016-03-28 NOTE — Discharge Summary (Signed)
[facility] - [place] at [facility]   PATIENT NAME: Joshua Horn    MR#:  [id number]  DATE OF BIRTH:  03-Sep-1950  DATE OF ADMISSION:  03/26/2016 ADMITTING PHYSICIAN: Enid Baasadhika Kalisetti, MD  DATE OF DISCHARGE: 03/28/2016  PRIMARY CARE PHYSICIAN: Jaclyn ShaggyATE,DENNY C, MD    ADMISSION DIAGNOSIS:  Nausea [R11.0] Gastric outflow obstruction [K31.1] Duodenal ulcer [K26.9]  DISCHARGE DIAGNOSIS:  Active Problems:   Duodenal ulcer    Duodenal obstruction  SECONDARY DIAGNOSIS:   Past Medical History:  Diagnosis Date  . ADHD (attention deficit hyperactivity disorder)   . Coronary artery disease    s/p stent   . Depression   . Hyperlipidemia     HOSPITAL COURSE:   #1 Intractable nausea and vomiting- ? Duodenal obstruction from the duodenal ulcer- no mechanical obstruction based on KUB - IV fluids, zofran, IV protonix drip. - NPO and GI consult - GI did EGD ( in July EGD showed large duodenal ulcer)- on 03/27/14- showed duodenal ulcer and some stricture Vs obstruction in duodenum- so could not pass the scope. - Consulted general surgery- He suggested A barium study her upper GI series   Which showed high-grade duodenal obstruction at the junction of descending and transverse duodenum. As patient has history of weight loss more than 10% of his body weight in last 1-2 month, surgeon is also worried it may be malignancy and the surgery might be complicated.  So he suggested to transfer to higher centers, I spoke to [facility]- he is a Secretary/administratorGI surgeon and he agreed to accept the patient.  #2 Duodenal ulcer- plan as above, IV protonix -  and NPO  #3 Hyperlipidemia- statin  #4 Anemia- hb last month at discharge around 8.5, now at 11- could be hemoconcentration from dehydration - IV fluids and close monitoring  #5 DVT Prophylaxis- TEDs and SCDs  DISCHARGE CONDITIONS:   Stable.  CONSULTS OBTAINED:  Treatment Team:  Earline MayotteJeffrey W Byrnett, MD Seeplaputhur Wynona LunaG  Sankar, MD  DRUG ALLERGIES:   Allergies  Allergen Reactions  . Sulfa Antibiotics     rash  . Bupropion Rash    DISCHARGE MEDICATIONS:   Current Discharge Medication List    START taking these medications   Details  metoCLOPramide (REGLAN) 5 MG/ML injection Inject 1 mL (5 mg total) into the vein every 6 (six) hours as needed. Qty: 2 mL, Refills: 0    ondansetron (ZOFRAN) 4 MG/2ML SOLN injection Inject 2 mLs (4 mg total) into the vein every 6 (six) hours as needed for nausea. Qty: 2 mL, Refills: 0    pantoprazole (PROTONIX) 40 MG injection Inject 40 mg into the vein every 12 (twelve) hours. Qty: 1 each, Refills: 0      CONTINUE these medications which have NOT CHANGED   Details  polyethylene glycol (MIRALAX / GLYCOLAX) packet Take 17 g by mouth daily. Qty: 14 each, Refills: 0      STOP taking these medications     clopidogrel (PLAVIX) 75 MG tablet      ezetimibe (ZETIA) 10 MG tablet      FLUoxetine (PROZAC) 20 MG capsule      methylphenidate (CONCERTA) 54 MG CR tablet      metoprolol succinate (TOPROL-XL) 25 MG 24 hr tablet      pantoprazole (PROTONIX) 40 MG tablet      rosuvastatin (CRESTOR) 20 MG tablet      dicyclomine (BENTYL) 20 MG tablet      sucralfate (CARAFATE) 1 g  tablet          DISCHARGE INSTRUCTIONS:    [facility], follow the recommendation.  If you experience worsening of your admission symptoms, develop shortness of breath, life threatening emergency, suicidal or homicidal thoughts you must seek medical attention immediately by calling 911 or calling your MD immediately  if symptoms less severe.  You Must read complete instructions/literature along with all the possible adverse reactions/side effects for all the Medicines you take and that have been prescribed to you. Take any new Medicines after you have completely understood and accept all the possible adverse reactions/side effects.   Please note  You were cared  for by a hospitalist during your hospital stay. If you have any questions about your discharge medications or the care you received while you were in the hospital after you are discharged, you can call the unit and asked to speak with the hospitalist on call if the hospitalist that took care of you is not available. Once you are discharged, your primary care physician will handle any further medical issues. Please note that NO REFILLS for any discharge medications will be authorized once you are discharged, as it is imperative that you return to your primary care physician (or establish a relationship with a primary care physician if you do not have one) for your aftercare needs so that they can reassess your need for medications and monitor your lab values.    Today   CHIEF COMPLAINT:   Chief Complaint  Patient presents with  . Emesis    HISTORY OF PRESENT ILLNESS:  Joshua Horn  is a 65 y.o. male with a known history of ADHD, CAD status post stent in 2010, depression and hyperlipidemia presents to hospital secondary to intractable nausea and vomiting that started last night. Patient's symptoms started about 6 months ago with on and off epigastric pain that was gradually worsening to the point that last month he was admitted with hematemesis and abdominal pain. He was transferred to [facility] as there was no GI coverage here and had an upper GI endoscopy done in July 2017. He had significant erosions in the stomach, also huge duodenal ulcer. He was advised to stop taking NSAIDS, and prescribed PPI and Carafate. He stopped the Carafate due to constipation issues. Since his discharge at [facility], he still has been having nausea and vomiting, decreased by mouth intake and lost huge amounts of weight in the past month. Since last night, his nausea, vomitings have been constant, intractable and so presented to the ER. Also had some hematemesis with dark blood and no active bleeding.   VITAL  SIGNS:  Blood pressure 117/63, pulse 76, temperature 98.2 F (36.8 C), temperature source Oral, resp. rate 18, height 6' (1.829 m), weight 70.6 kg (155 lb 11.2 oz), SpO2 98 %.  I/O:   Intake/Output Summary (Last 24 hours) at 03/28/16 1724 Last data filed at 03/28/16 1347  Gross per 24 hour  Intake              500 ml  Output              600 ml  Net             -100 ml    PHYSICAL EXAMINATION:  GENERAL:  65 y.o.-year-old patient lying in the bed with no acute distress.  EYES: Pupils equal, round, reactive to light and accommodation. No scleral icterus. Extraocular muscles intact.  HEENT: Head atraumatic, normocephalic.  Oropharynx and nasopharynx clear.  NECK:  Supple, no jugular venous distention. No thyroid enlargement, no tenderness.  LUNGS: Normal breath sounds bilaterally, no wheezing, rales,rhonchi or crepitation. No use of accessory muscles of respiration.  CARDIOVASCULAR: S1, S2 normal. No murmurs, rubs, or gallops.  ABDOMEN: Soft, non-tender, non-distended. Bowel sounds present. No organomegaly or mass.  EXTREMITIES: No pedal edema, cyanosis, or clubbing.  NEUROLOGIC: Cranial nerves II through XII are intact. Muscle strength 5/5 in all extremities. Sensation intact. Gait not checked.  PSYCHIATRIC: The patient is alert and oriented x 3.  SKIN: No obvious rash, lesion, or ulcer.   DATA REVIEW:   CBC  Recent Labs Lab 03/28/16 0640  WBC 7.0  HGB 9.1*  HCT 27.3*  PLT 349    Chemistries   Recent Labs Lab 03/26/16 1542  03/28/16 0640  NA 135  < > 136  K 4.1  < > 3.6  CL 106  < > 109  CO2 20*  < > 20*  GLUCOSE 110*  < > 75  BUN 24*  < > 19  CREATININE 0.85  < > 0.60*  CALCIUM 9.2  < > 8.2*  MG 2.0  --   --   AST 32  --   --   ALT 36  --   --   ALKPHOS 119  --   --   BILITOT 0.5  --   --   < > = values in this interval not displayed.  Cardiac Enzymes  Recent Labs Lab 03/26/16 1542  TROPONINI <0.03    Microbiology Results  No results found for this  or any previous visit.  RADIOLOGY:  Dg Abd 2 Views  Result Date: 03/26/2016 CLINICAL DATA:  Intractable nausea and vomiting for 8 months. Hematemesis. EXAM: ABDOMEN - 2 VIEW COMPARISON:  03/01/2016 FINDINGS: Colonic distention has resolved since previous study. There is persistent moderate stool burden throughout the colon. No evidence of dilated small bowel loops. No evidence of free air. No radiopaque calculi identified. IMPRESSION: No acute findings.  Moderate colonic stool noted. Electronically Signed   By: Myles Rosenthal M.D.   On: 03/26/2016 17:32   Dg Kayleen Memos W/small Bowel  Result Date: 03/28/2016 CLINICAL DATA:  Duodenum ulcer.  Duodenum stricture . EXAM: UPPER GI SERIES WITH SMALL BOWEL FOLLOW-THROUGH FLUOROSCOPY TIME:  Radiation Exposure Index (as provided by the fluoroscopic device): 68.1 mGy TECHNIQUE: Standard thin barium upper GI and small-bowel follow-through obtained. COMPARISON:  Abdominal series 813 2017.  CT 09/26/2015. FINDINGS: This was a limited study due the patient's clinical condition. The esophagus is widely patent. No obstructing lesion. No gastroesophageal reflux. The stomach contains fluid and debris. The pylorus and duodenum bulb appears normal. No definite ulceration noted. Noted however is a stricture at the junction of the descending and transverse portion of the duodenum. This could be a benign or malignant stricture. The stricture is smooth and focal. Barium slowly passed through the stricture. The patient could not drink enough barium for full small-bowel follow-through. Barium progressed to the level of the distal small bowel and/or right colon. IMPRESSION: Limited exam. High-grade thin smooth stricture is noted of the duodenum at the junction of the descending and transverse duodenum with high-grade partial obstruction. Electronically Signed   By: Maisie Fus  Register   On: 03/28/2016 13:49    EKG:   Orders placed or performed during the hospital encounter of 03/26/16  . ED  EKG  . ED EKG  . EKG 12-Lead  . EKG 12-Lead  Management plans discussed with the patient, family and they are in agreement.  CODE STATUS:     Code Status Orders        Start     Ordered   03/26/16 2237  Full code  Continuous     03/26/16 2236    Code Status History    Date Active Date Inactive Code Status Order ID Comments User Context   02/20/2016  6:20 AM 02/22/2016  2:24 PM Full Code [id number]  Alberteen Sam, MD Inpatient      TOTAL TIME TAKING CARE OF THIS PATIENT: 35 minutes.    Altamese Dilling M.D on 03/28/2016 at 5:24 PM  Between 7am to 6pm - Pager - [phone]  After 6pm go to [email or web address] - password [email or web address]  [facility] [place] [facility]  Office  [phone]  CC: Primary care physician; Jaclyn Shaggy, MD   Note: This dictation was prepared with Dragon dictation along with smaller phrase technology. Any transcriptional errors that result from this process are unintentional.

## 2016-03-28 NOTE — Consult Note (Signed)
Patient UGI with thin barium showed high grade obstruction in duodenum.  He needs surgery.  It is possible but unlikely he has malignancy and his problems are due to chronic NSAID use.  However since malignancy is a possiblity I agree that transfer to a med center is the best path at this time.

## 2016-03-29 ENCOUNTER — Ambulatory Visit (HOSPITAL_COMMUNITY)
Admission: AD | Admit: 2016-03-29 | Discharge: 2016-03-29 | Disposition: A | Discharge status: Home / Self Care | Payer: BC Managed Care – PPO | Source: Other Acute Inpatient Hospital | Attending: Internal Medicine | Admitting: Internal Medicine

## 2016-03-29 DIAGNOSIS — K5660 Unspecified intestinal obstruction: Secondary | ICD-10-CM | POA: Insufficient documentation

## 2016-03-29 NOTE — Progress Notes (Signed)
Pt being transferred by [facility] with Protonix drip and NS. Patient not in any pain, VSS. [facility].

## 2016-04-14 HISTORY — PX: OTHER SURGICAL HISTORY: SHX169

## 2016-06-19 ENCOUNTER — Other Ambulatory Visit: Payer: Self-pay | Admitting: Cardiovascular Disease

## 2016-06-30 ENCOUNTER — Ambulatory Visit (INDEPENDENT_AMBULATORY_CARE_PROVIDER_SITE_OTHER): Payer: BC Managed Care – PPO | Admitting: Cardiovascular Disease

## 2016-06-30 ENCOUNTER — Encounter: Payer: Self-pay | Admitting: Cardiovascular Disease

## 2016-06-30 VITALS — BP 150/88 | HR 92 | Ht 72.0 in | Wt 163.2 lb

## 2016-06-30 DIAGNOSIS — I10 Essential (primary) hypertension: Secondary | ICD-10-CM

## 2016-06-30 DIAGNOSIS — I25110 Atherosclerotic heart disease of native coronary artery with unstable angina pectoris: Secondary | ICD-10-CM | POA: Diagnosis not present

## 2016-06-30 DIAGNOSIS — T8172XS Complication of vein following a procedure, not elsewhere classified, sequela: Secondary | ICD-10-CM | POA: Diagnosis not present

## 2016-06-30 DIAGNOSIS — T8172XA Complication of vein following a procedure, not elsewhere classified, initial encounter: Secondary | ICD-10-CM

## 2016-06-30 DIAGNOSIS — I868 Varicose veins of other specified sites: Secondary | ICD-10-CM

## 2016-06-30 DIAGNOSIS — K269 Duodenal ulcer, unspecified as acute or chronic, without hemorrhage or perforation: Secondary | ICD-10-CM

## 2016-06-30 NOTE — Progress Notes (Signed)
Cardiology Office Note  Date:  06/30/2016   ID:  Joshua Horn, DOB 1950-12-15, MRN [id number]  PCP:  Jaclyn ShaggyATE,DENNY C, MD   Chief Complaint  Patient presents with  . other    Overdue 12 month f/u. Last ov 03/12/15. Pt states he is doing well. Reviewed meds with pt verbally.    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  He had a 2.5 x 18 mm Promus stent placed in the OM with good results.  He had chest pain symptoms in 2009 with repeat catheterization showing no significant progression of his disease . He presents for routine followup  Of his coronary artery disease  Recent hospital admission August 2017 For nausea, gastric outflow obstruction, duodenal ulcer EGD performed [facility] Tre[facility] for duodenal stricture  status post duodenal jejunostomy  required revision. 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   weight dropped 50 pounds, has gained most of that back  Still down 20 pounds from last year   Off metoprolol, off plavix Reports he is back on his cholesterol medication Anemic on blood work August and September, blood loss from surgery Denies any chest pain or shortness of breath concerning for angina  EKG on today's visit shows normal sinus rhythm with rate 92 bpm, no significant ST or T-wave changes  Other past medical history  Cardiac catheterization  11/29/2012. This showed chronically occluded proximal RCA, moderate distal left circumflex disease, OM1 stent was patent with no in-stent restenosis, LAD with mild to moderate mid disease, long region, normal ejection fraction estimated at greater than 55%   PMH:   has a past medical history of ADHD (attention deficit  hyperactivity disorder); Coronary artery disease; Depression; DU (duodenal ulcer); and Hyperlipidemia.  PSH:    Past Surgical History:  Procedure Laterality Date  . CARDIAC CATHETERIZATION  11/29/2012  . CORONARY ANGIOPLASTY    . ESOPHAGOGASTRODUODENOSCOPY N/A 02/20/2016   Procedure: ESOPHAGOGASTRODUODENOSCOPY (EGD);  Surgeon: Bernette Redbirdobert Buccini, MD;  [facility];  Service: Endoscopy;  Laterality: N/A;  . ESOPHAGOGASTRODUODENOSCOPY (EGD) WITH PROPOFOL N/A 03/27/2016   Procedure: ESOPHAGOGASTRODUODENOSCOPY (EGD) WITH PROPOFOL;  Surgeon: Scot Junobert T Elliott, MD;  [facility];  Service: Endoscopy;  Laterality: N/A;  . INTESTINAL BYPASS     bypass small bowel constricture 04/04/16    Current Outpatient Prescriptions  Medication Sig Dispense Refill  . ezetimibe (ZETIA) 10 MG tablet TAKE 1 TABLET BY MOUTH ONCE DAILY. 30 tablet 0  . FLUoxetine (PROZAC) 20 MG tablet Take 20 mg by mouth.    . methylphenidate 54 MG PO CR tablet Take 54 mg by mouth every morning.    . metoCLOPramide (REGLAN) 5 MG/ML injection Inject 1 mL (5 mg total) into the vein every 6 (six) hours as needed. 2 mL 0  . ondansetron (ZOFRAN) 4 MG/2ML SOLN injection Inject 2 mLs (4 mg total) into the vein every 6 (six) hours as needed for nausea. 2 mL 0  . pantoprazole (PROTONIX) 40 MG injection Inject 40 mg into the vein every 12 (twelve) hours. 1 each 0  . polyethylene glycol (MIRALAX / GLYCOLAX) packet Take 17 g by mouth daily. 14 each 0  . rosuvastatin (CRESTOR) 20 MG tablet Take 20 mg by mouth.  No current facility-administered medications for this visit.      Allergies:   Sulfa antibiotics and Bupropion   Social History:  The patient  reports that he has never smoked. He has never used smokeless tobacco. He reports that he does not drink alcohol or use drugs.   Family History:   family history includes Heart attack (age of onset: 4758) in his father; Stroke in his mother.    Review of Systems: Review of  Systems  Constitutional: Positive for weight loss.  Respiratory: Negative.   Cardiovascular: Negative.   Gastrointestinal: Negative.   Musculoskeletal: Negative.   Neurological: Negative.   Psychiatric/Behavioral: Negative.   All other systems reviewed and are negative.    PHYSICAL EXAM: VS:  BP (!) 150/88 (BP Location: Left Arm, Patient Position: Sitting, Cuff Size: Normal)   Pulse 92   Ht 6' (1.829 m)   Wt 163 lb 4 oz (74 kg)   BMI 22.14 kg/m  , BMI Body mass index is 22.14 kg/m. GEN: Thin,  in no acute distress  HEENT: normal  Neck: no JVD, carotid bruits, or masses Cardiac: RRR; no murmurs, rubs, or gallops,no edema  Respiratory:  clear to auscultation bilaterally, normal work of breathing GI: soft, nontender, nondistended, + BS MS: no deformity or atrophy  Skin: warm and dry, no rash Neuro:  Strength and sensation are intact Psych: euthymic mood, full affect    Recent Labs: 03/26/2016: ALT 36; Magnesium 2.0 03/28/2016: BUN 19; Creatinine, Ser 0.60; Hemoglobin 9.1; Platelets 349; Potassium 3.6; Sodium 136    Lipid Panel No results found for: CHOL, HDL, LDLCALC, TRIG    Wt Readings from Last 3 Encounters:  06/30/16 163 lb 4 oz (74 kg)  03/26/16 155 lb 11.2 oz (70.6 kg)  02/21/16 167 lb 5.3 oz (75.9 kg)       ASSESSMENT AND PLAN:  Coronary artery disease involving native coronary artery of native heart with unstable angina pectoris (HCC) - Plan: EKG 12-Lead He would like to stop the Plavix, stay on aspirin. This would be acceptable, no recent coronary intervention. Currently on his statin Recommended he consider restarting metoprolol given elevated heart rate, blood pressure  Essential hypertension - Plan: EKG 12-Lead Suggested he closely monitor heart rate and blood pressure, We start metoprolol succinate 25 mg daily if blood pressure heart rate continues to run high  Duodenal ulcer Hospital records reviewed [facility]  Roux-en-Y varices,  sequela   Total encounter time more than 15 minutes  Greater than 50% was spent in counseling and coordination of care with the patient   Disposition:   F/U  12 months   Orders Placed This Encounter  Procedures  . EKG 12-Lead     Signed, Dossie Arbourim Wilber Fini, M.D., Ph.D. 06/30/2016  [facility] [place] [phone]

## 2016-06-30 NOTE — Patient Instructions (Addendum)
Medication Instructions:   No medication changes made  Monitor heart rate and blood pressure May need to start metoprolol XL daily  Labwork:  No new labs needed  Testing/Procedures:  No further testing at this time   I recommend watching educational videos on topics of interest to you at:       www.goemmi.com  Enter code: HEARTCARE    Follow-Up: It was a pleasure seeing you in the office today. Please call us if you have new issues that need to be addressed before your next appt.  [phone]  Your physician wants you to follow-up in: 12 months.  You will receive a reminder letter in the mail two months in advance. If you don't receive a letter, please call our office to schedule the follow-up appointment.  If you need a refill on your cardiac medications before your next appointment, please call your pharmacy.

## 2016-07-26 ENCOUNTER — Other Ambulatory Visit: Payer: Self-pay | Admitting: Cardiovascular Disease

## 2016-11-24 ENCOUNTER — Other Ambulatory Visit: Payer: Self-pay | Admitting: Cardiovascular Disease

## 2017-02-01 ENCOUNTER — Other Ambulatory Visit: Payer: Self-pay | Admitting: Cardiovascular Disease

## 2017-07-21 ENCOUNTER — Other Ambulatory Visit: Payer: Self-pay | Admitting: Cardiovascular Disease

## 2017-07-23 NOTE — Telephone Encounter (Signed)
3 month supply provided but he will need follow up appointment before any further refills.

## 2017-08-27 ENCOUNTER — Other Ambulatory Visit: Payer: Self-pay | Admitting: Cardiovascular Disease

## 2017-08-31 ENCOUNTER — Encounter: Payer: Self-pay | Admitting: Cardiovascular Disease

## 2017-08-31 NOTE — Telephone Encounter (Signed)
This encounter was created in error - please disregard.

## 2017-11-19 ENCOUNTER — Other Ambulatory Visit: Payer: Self-pay | Admitting: Cardiovascular Disease

## 2017-11-20 ENCOUNTER — Telehealth: Payer: Self-pay | Admitting: Cardiovascular Disease

## 2017-11-20 NOTE — Telephone Encounter (Signed)
-----   Message from Festus AloeSharon G Crespo, CMA sent at 11/20/2017  8:02 AM EDT ----- Please contact patient for a follow up. The patient was last seen in 2017.  Thanks, Jasmine DecemberSharon

## 2017-11-20 NOTE — Telephone Encounter (Signed)
Lmov to call back and schedule ° °

## 2017-11-23 NOTE — Telephone Encounter (Signed)
lmov to schedule appt  °

## 2017-11-27 NOTE — Telephone Encounter (Signed)
Lmov to schedule  

## 2017-12-05 ENCOUNTER — Encounter: Payer: Self-pay | Admitting: Cardiovascular Disease

## 2017-12-05 NOTE — Telephone Encounter (Signed)
Sent letter

## 2017-12-27 ENCOUNTER — Other Ambulatory Visit: Payer: Self-pay

## 2017-12-27 NOTE — Discharge Instructions (Signed)

## 2018-01-02 ENCOUNTER — Ambulatory Visit
Admission: RE | Admit: 2018-01-02 | Discharge: 2018-01-02 | Disposition: A | Discharge status: Home / Self Care | Payer: BC Managed Care – PPO | Source: Ambulatory Visit | Attending: Ophthalmology | Admitting: Ophthalmology

## 2018-01-02 ENCOUNTER — Ambulatory Visit: Payer: BC Managed Care – PPO | Admitting: Anesthesiology

## 2018-01-02 ENCOUNTER — Encounter
Admission: RE | Disposition: A | Discharge status: Home / Self Care | Payer: Self-pay | Source: Ambulatory Visit | Attending: Ophthalmology

## 2018-01-02 DIAGNOSIS — I2510 Atherosclerotic heart disease of native coronary artery without angina pectoris: Secondary | ICD-10-CM | POA: Insufficient documentation

## 2018-01-02 DIAGNOSIS — Z955 Presence of coronary angioplasty implant and graft: Secondary | ICD-10-CM | POA: Diagnosis not present

## 2018-01-02 DIAGNOSIS — F329 Major depressive disorder, single episode, unspecified: Secondary | ICD-10-CM | POA: Diagnosis not present

## 2018-01-02 DIAGNOSIS — I10 Essential (primary) hypertension: Secondary | ICD-10-CM | POA: Diagnosis not present

## 2018-01-02 DIAGNOSIS — Z888 Allergy status to other drugs, medicaments and biological substances status: Secondary | ICD-10-CM | POA: Insufficient documentation

## 2018-01-02 DIAGNOSIS — Z8711 Personal history of peptic ulcer disease: Secondary | ICD-10-CM | POA: Insufficient documentation

## 2018-01-02 DIAGNOSIS — H2511 Age-related nuclear cataract, right eye: Secondary | ICD-10-CM | POA: Insufficient documentation

## 2018-01-02 DIAGNOSIS — R0683 Snoring: Secondary | ICD-10-CM | POA: Insufficient documentation

## 2018-01-02 DIAGNOSIS — Z882 Allergy status to sulfonamides status: Secondary | ICD-10-CM | POA: Diagnosis not present

## 2018-01-02 DIAGNOSIS — E7800 Pure hypercholesterolemia, unspecified: Secondary | ICD-10-CM | POA: Diagnosis not present

## 2018-01-02 HISTORY — DX: Gastric ulcer, unspecified as acute or chronic, without hemorrhage or perforation: K25.9

## 2018-01-02 HISTORY — DX: Essential (primary) hypertension: I10

## 2018-01-02 HISTORY — DX: Presence of dental prosthetic device (complete) (partial): Z97.2

## 2018-01-02 HISTORY — PX: CATARACT EXTRACTION W/PHACO: SHX586

## 2018-01-02 HISTORY — DX: Unspecified hearing loss, unspecified ear: H91.90

## 2018-01-02 SURGERY — PHACOEMULSIFICATION, CATARACT, WITH IOL INSERTION
Anesthesia: Monitor Anesthesia Care | Site: Eye | Laterality: Right | Wound class: "Clean "

## 2018-01-02 MED ORDER — LACTATED RINGERS IV SOLN: 10.0000 mL/h | INTRAVENOUS | Status: DC

## 2018-01-02 MED ORDER — FENTANYL CITRATE (PF) 100 MCG/2ML IJ SOLN
INTRAMUSCULAR | Status: DC | PRN
  Administered 2018-01-02: 100 ug via INTRAVENOUS

## 2018-01-02 MED ORDER — MIDAZOLAM HCL 2 MG/2ML IJ SOLN
INTRAMUSCULAR | Status: DC | PRN
  Administered 2018-01-02: 2 mg via INTRAVENOUS

## 2018-01-02 MED ORDER — ACETAMINOPHEN 325 MG PO TABS: 325.0000 mg | ORAL_TABLET | ORAL | Status: DC | PRN

## 2018-01-02 MED ORDER — EPINEPHRINE PF 1 MG/ML IJ SOLN
INTRAOCULAR | Status: DC | PRN
  Administered 2018-01-02: 67 mL via OPHTHALMIC

## 2018-01-02 MED ORDER — BRIMONIDINE TARTRATE-TIMOLOL 0.2-0.5 % OP SOLN
OPHTHALMIC | Status: DC | PRN
  Administered 2018-01-02: 1 [drp] via OPHTHALMIC

## 2018-01-02 MED ORDER — ARMC OPHTHALMIC DILATING DROPS
1.0000 "application " | OPHTHALMIC | Status: DC | PRN
  Administered 2018-01-02 (×3): 1 via OPHTHALMIC

## 2018-01-02 MED ORDER — CEFUROXIME OPHTHALMIC INJECTION 1 MG/0.1 ML
INJECTION | OPHTHALMIC | Status: DC | PRN
  Administered 2018-01-02: .3 mL via OPHTHALMIC

## 2018-01-02 MED ORDER — NA HYALUR & NA CHOND-NA HYALUR 0.4-0.35 ML IO KIT
PACK | INTRAOCULAR | Status: DC | PRN
  Administered 2018-01-02: 1 mL via INTRAOCULAR

## 2018-01-02 MED ORDER — ACETAMINOPHEN 160 MG/5ML PO SOLN: 325.0000 mg | ORAL | Status: DC | PRN

## 2018-01-02 MED ORDER — MOXIFLOXACIN HCL 0.5 % OP SOLN
1.0000 [drp] | OPHTHALMIC | Status: DC | PRN
  Administered 2018-01-02 (×3): 1 [drp] via OPHTHALMIC

## 2018-01-02 MED ORDER — ONDANSETRON HCL 4 MG/2ML IJ SOLN: 4.0000 mg | Freq: Once | INTRAMUSCULAR | Status: DC | PRN

## 2018-01-02 MED ORDER — LIDOCAINE HCL (PF) 2 % IJ SOLN
INTRAMUSCULAR | Status: DC | PRN
  Administered 2018-01-02: 1 mL via INTRAMUSCULAR

## 2018-01-02 SURGICAL SUPPLY — 27 items
CANNULA ANT/CHMB 27G (MISCELLANEOUS) ×1 IMPLANT
CANNULA ANT/CHMB 27GA (MISCELLANEOUS) ×3 IMPLANT
CARTRIDGE ABBOTT (MISCELLANEOUS) IMPLANT
GLOVE SURG LX 7.5 STRW (GLOVE) ×2
GLOVE SURG LX STRL 7.5 STRW (GLOVE) ×1 IMPLANT
GLOVE SURG TRIUMPH 8.0 PF LTX (GLOVE) ×3 IMPLANT
GOWN STRL REUS W/ TWL LRG LVL3 (GOWN DISPOSABLE) ×2 IMPLANT
GOWN STRL REUS W/TWL LRG LVL3 (GOWN DISPOSABLE) ×4
LENS IOL ACRYSOF IQ 20.5 (Intraocular Lens) ×2 IMPLANT
MARKER SKIN DUAL TIP RULER LAB (MISCELLANEOUS) ×3 IMPLANT
NDL FILTER BLUNT 18X1 1/2 (NEEDLE) ×1 IMPLANT
NDL RETROBULBAR .5 NSTRL (NEEDLE) IMPLANT
NEEDLE FILTER BLUNT 18X 1/2SAF (NEEDLE) ×2
NEEDLE FILTER BLUNT 18X1 1/2 (NEEDLE) ×1 IMPLANT
PACK CATARACT BRASINGTON (MISCELLANEOUS) ×3 IMPLANT
PACK EYE AFTER SURG (MISCELLANEOUS) ×3 IMPLANT
PACK OPTHALMIC (MISCELLANEOUS) ×3 IMPLANT
RING MALYGIN 7.0 (MISCELLANEOUS) IMPLANT
SUT ETHILON 10-0 CS-B-6CS-B-6 (SUTURE)
SUT VICRYL  9 0 (SUTURE)
SUT VICRYL 9 0 (SUTURE) IMPLANT
SUTURE EHLN 10-0 CS-B-6CS-B-6 (SUTURE) IMPLANT
SYR 3ML LL SCALE MARK (SYRINGE) ×3 IMPLANT
SYR 5ML LL (SYRINGE) ×3 IMPLANT
SYR TB 1ML LUER SLIP (SYRINGE) ×3 IMPLANT
WATER STERILE IRR 500ML POUR (IV SOLUTION) ×3 IMPLANT
WIPE NON LINTING 3.25X3.25 (MISCELLANEOUS) ×3 IMPLANT

## 2018-01-02 NOTE — Transfer of Care (Signed)
Immediate Anesthesia Transfer of Care Note  Patient: Joshua Horn  Procedure(s) Performed: CATARACT EXTRACTION PHACO AND INTRAOCULAR LENS PLACEMENT (IOC)  RIGHT (Right Eye)  Patient Location: PACU  Anesthesia Type: MAC  Level of Consciousness: awake, alert  and patient cooperative  Airway and Oxygen Therapy: Patient Spontanous Breathing and Patient connected to supplemental oxygen  Post-op Assessment: Post-op Vital signs reviewed, Patient's Cardiovascular Status Stable, Respiratory Function Stable, Patent Airway and No signs of Nausea or vomiting  Post-op Vital Signs: Reviewed and stable  Complications: No apparent anesthesia complications

## 2018-01-02 NOTE — H&P (Signed)
The History and Physical notes are on paper, have been signed, and are to be scanned. The patient remains stable and unchanged from the H&P.   Previous H&P reviewed, patient examined, and there are no changes.  Joshua Horn 01/02/2018 8:38 AM

## 2018-01-02 NOTE — Anesthesia Preprocedure Evaluation (Signed)
Anesthesia Evaluation  Patient identified by MRN, date of birth, ID band Patient awake    Reviewed: Allergy & Precautions, NPO status , Patient's Chart, lab work & pertinent test results  History of Anesthesia Complications Negative for: history of anesthetic complications  Airway Mallampati: I  TM Distance: >3 FB Neck ROM: Full    Dental  (+) Partial Lower, Partial Upper   Pulmonary  Snoring    Pulmonary exam normal breath sounds clear to auscultation       Cardiovascular Exercise Tolerance: Good hypertension, + CAD (s/p stent >10 years ago; not on anticoagulation)  Normal cardiovascular exam Rhythm:Regular Rate:Normal     Neuro/Psych PSYCHIATRIC DISORDERS Depression HOH    GI/Hepatic PUD,   Endo/Other  negative endocrine ROS  Renal/GU negative Renal ROS     Musculoskeletal   Abdominal   Peds  Hematology   Anesthesia Other Findings   Reproductive/Obstetrics                             Anesthesia Physical Anesthesia Plan  ASA: III  Anesthesia Plan: MAC   Post-op Pain Management:    Induction: Intravenous  PONV Risk Score and Plan: 1 and TIVA and Midazolam  Airway Management Planned: Natural Airway  Additional Equipment:   Intra-op Plan:   Post-operative Plan:   Informed Consent: I have reviewed the patients History and Physical, chart, labs and discussed the procedure including the risks, benefits and alternatives for the proposed anesthesia with the patient or authorized representative who has indicated his/her understanding and acceptance.     Plan Discussed with: CRNA  Anesthesia Plan Comments:         Anesthesia Quick Evaluation

## 2018-01-02 NOTE — Anesthesia Procedure Notes (Signed)
Procedure Name: MAC Date/Time: 01/02/2018 9:01 AM Performed by: Janna Arch, CRNA Pre-anesthesia Checklist: Patient identified, Emergency Drugs available, Suction available and Patient being monitored Patient Re-evaluated:Patient Re-evaluated prior to induction Oxygen Delivery Method: Nasal cannula

## 2018-01-02 NOTE — Anesthesia Postprocedure Evaluation (Signed)
Anesthesia Post Note  Patient: Joshua Horn  Procedure(s) Performed: CATARACT EXTRACTION PHACO AND INTRAOCULAR LENS PLACEMENT ([place])  RIGHT (Right Eye)  Patient location during evaluation: PACU Anesthesia Type: MAC Level of consciousness: awake and alert, oriented and patient cooperative Pain management: pain level controlled Vital Signs Assessment: post-procedure vital signs reviewed and stable Respiratory status: spontaneous breathing, nonlabored ventilation and respiratory function stable Cardiovascular status: blood pressure returned to baseline and stable Postop Assessment: adequate PO intake Anesthetic complications: no    Darrin Nipper

## 2018-01-02 NOTE — Op Note (Signed)
LOCATION:  [facility]   PREOPERATIVE DIAGNOSIS:    Nuclear sclerotic cataract right eye. H25.11   POSTOPERATIVE DIAGNOSIS:  Nuclear sclerotic cataract right eye.     PROCEDURE:  Phacoemusification with posterior chamber intraocular lens placement of the right eye   LENS:   Implant Name Type Inv. Item Serial No. Manufacturer Lot No. LRB No. Used  LENS IOL ACRYSOF IQ 20.5 - [id number] Intraocular Lens LENS IOL ACRYSOF IQ 20.5 [id number] ALCON  Right 1        ULTRASOUND TIME: 17 % of 1 minutes, 12 seconds.  CDE 12.4   SURGEON:  Deirdre Evener, MD   ANESTHESIA:  Topical with tetracaine drops and 2% Xylocaine jelly, augmented with 1% preservative-free intracameral lidocaine.    COMPLICATIONS:  None.   DESCRIPTION OF PROCEDURE:  The patient was identified in the holding room and transported to the operating room and placed in the supine position under the operating microscope.  The right eye was identified as the operative eye and it was prepped and draped in the usual sterile ophthalmic fashion.   A 1 millimeter clear-corneal paracentesis was made at the 12:00 position.  0.5 ml of preservative-free 1% lidocaine was injected into the anterior chamber. The anterior chamber was filled with Viscoat viscoelastic.  A 2.4 millimeter keratome was used to make a near-clear corneal incision at the 9:00 position.  A curvilinear capsulorrhexis was made with a cystotome and capsulorrhexis forceps.  Balanced salt solution was used to hydrodissect and hydrodelineate the nucleus.   Phacoemulsification was then used in stop and chop fashion to remove the lens nucleus and epinucleus.  The remaining cortex was then removed using the irrigation and aspiration handpiece. Provisc was then placed into the capsular bag to distend it for lens placement.  A lens was then injected into the capsular bag.  The remaining viscoelastic was aspirated.   Wounds were hydrated with balanced salt solution.   The anterior chamber was inflated to a physiologic pressure with balanced salt solution.  No wound leaks were noted. Cefuroxime 0.1 ml of a /ml solution was injected into the anterior chamber for a dose of 1 mg of intracameral antibiotic at the completion of the case.   Timolol and Brimonidine drops were applied to the eye.  The patient was taken to the recovery room in stable condition without complications of anesthesia or surgery.   Bellina Tokarczyk 01/02/2018, 9:20 AM

## 2018-01-03 ENCOUNTER — Encounter: Payer: Self-pay | Admitting: Ophthalmology

## 2018-01-18 ENCOUNTER — Ambulatory Visit (INDEPENDENT_AMBULATORY_CARE_PROVIDER_SITE_OTHER): Payer: BC Managed Care – PPO | Admitting: Cardiovascular Disease

## 2018-01-18 ENCOUNTER — Encounter: Payer: Self-pay | Admitting: Cardiovascular Disease

## 2018-01-18 VITALS — BP 124/72 | HR 77 | Ht 72.0 in | Wt 183.0 lb

## 2018-01-18 DIAGNOSIS — T8172XS Complication of vein following a procedure, not elsewhere classified, sequela: Secondary | ICD-10-CM | POA: Diagnosis not present

## 2018-01-18 DIAGNOSIS — I868 Varicose veins of other specified sites: Secondary | ICD-10-CM

## 2018-01-18 DIAGNOSIS — I10 Essential (primary) hypertension: Secondary | ICD-10-CM | POA: Diagnosis not present

## 2018-01-18 DIAGNOSIS — I25118 Atherosclerotic heart disease of native coronary artery with other forms of angina pectoris: Secondary | ICD-10-CM

## 2018-01-18 DIAGNOSIS — E782 Mixed hyperlipidemia: Secondary | ICD-10-CM

## 2018-01-18 DIAGNOSIS — I25110 Atherosclerotic heart disease of native coronary artery with unstable angina pectoris: Secondary | ICD-10-CM

## 2018-01-18 NOTE — Progress Notes (Signed)
Cardiology Office Note  Date:  01/18/2018   ID:  Joshua Horn, DOB 1950-10-11, MRN [id number]  PCP:  Jaclyn Shaggy, MD   Chief Complaint  Patient presents with  . Other    6 month follow up. Patient denies chest pain and SOB. Meds reviewed verbally with patient.     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 This showed chronically occluded proximal RCA, moderate distal left circumflex disease, OM1 stent was patent with no in-stent restenosis, LAD with mild to moderate mid disease, long region, normal ejection fraction estimated at greater than 55%   PMH:   has a past medical history of ADHD (attention deficit hyperactivity disorder),  Coronary artery disease, Depression, DU (duodenal ulcer), Gastric ulcer (2017), HOH (hard of hearing), Hyperlipidemia, Hypertension, and Wears partial dentures.  PSH:    Past Surgical History:  Procedure Laterality Date  . abdominal surgery  04/2016   to correct SBO  . CARDIAC CATHETERIZATION  11/29/2012   stent-X1  . CATARACT EXTRACTION W/PHACO Right 01/02/2018   Procedure: CATARACT EXTRACTION PHACO AND INTRAOCULAR LENS PLACEMENT (IOC)  RIGHT;  Surgeon: Lockie Mola, MD;  Location: [facility];  Service: Ophthalmology;  Laterality: Right;  . CORONARY ANGIOPLASTY    . ESOPHAGOGASTRODUODENOSCOPY N/A 02/20/2016   Procedure: ESOPHAGOGASTRODUODENOSCOPY (EGD);  Surgeon: Bernette Redbird, MD;  Location: [facility] ENDOSCOPY;  Service: Endoscopy;  Laterality: N/A;  . ESOPHAGOGASTRODUODENOSCOPY (EGD) WITH PROPOFOL N/A 03/27/2016   Procedure: ESOPHAGOGASTRODUODENOSCOPY (EGD) WITH PROPOFOL;  Surgeon: Scot Jun, MD;  Location: [facility] END[facility]COPY;  Service: Endoscopy;  Laterality: N/A;  . INTESTINAL BYPASS     bypass small bowel constricture 04/04/16    Current Outpatient Medications  Medication Sig Dispense Refill  . FLUoxetine (PROZAC) 20 MG tablet Take 20 mg by mouth 2 (two) times daily. Am and dinner    . methylphenidate 54 MG PO CR tablet Take 54 mg by mouth every morning.    . metoprolol succinate (TOPROL-XL) 25 MG 24 hr tablet TAKE 1 TABLET BY MOUTH ONCE DAILY (Patient taking differently: TAKE 1 TABLET BY MOUTH ONCE DAILY, am) 90 tablet 0  . rosuvastatin (CRESTOR) 20 MG tablet Take 20 mg by mouth. am    . ZETIA 10 MG tablet TAKE 1 TABLET BY MOUTH ONCE DAILY (Patient  taking differently: TAKE 1 TABLET BY MOUTH ONCE DAILY, am) 30 tablet 5   No current facility-administered medications for this visit.      Allergies:   Sulfa antibiotics and Bupropion   Social History:  The patient  reports that he has never smoked. He has never used smokeless tobacco. He reports that he does not drink  alcohol or use drugs.   Family History:   family history includes Heart attack (age of onset: 5758) in his father; Stroke in his mother.    Review of Systems: Review of Systems  Constitutional: Negative.   Respiratory: Negative.   Cardiovascular: Negative.   Gastrointestinal: Negative.   Musculoskeletal: Negative.   Neurological: Negative.   Psychiatric/Behavioral: Negative.   All other systems reviewed and are negative.    PHYSICAL EXAM: VS:  BP 124/72 (BP Location: Left Arm, Patient Position: Sitting, Cuff Size: Normal)   Pulse 77   Ht 6' (1.829 m)   Wt 183 lb (83 kg)   BMI 24.82 kg/m  , BMI Body mass index is 24.82 kg/m. Constitutional:  oriented to person, place, and time. No distress.  HENT:  Head: Normocephalic and atraumatic.  Eyes:  no discharge. No scleral icterus.  Neck: Normal range of motion. Neck supple. No JVD present.  Cardiovascular: Normal rate, regular rhythm, normal heart sounds and intact distal pulses. Exam reveals no gallop and no friction rub. No edema No murmur heard. Pulmonary/Chest: Effort normal and breath sounds normal. No stridor. No respiratory distress.  no wheezes.  no rales.  no tenderness.  Abdominal: Soft.  no distension.  no tenderness.  Musculoskeletal: Normal range of motion.  no  tenderness or deformity.  Neurological:  normal muscle tone. Coordination normal. No atrophy Skin: Skin is warm and dry. No rash noted. not diaphoretic.  Psychiatric:  normal mood and affect. behavior is normal. Thought content normal.    Recent Labs: No results found for requested labs within last 8760 hours.    Lipid Panel No results found for: CHOL, HDL, LDLCALC, TRIG    Wt Readings from Last 3 Encounters:  01/18/18 183 lb (83 kg)  01/02/18 181 lb (82.1 kg)  06/30/16 163 lb 4 oz (74 kg)       ASSESSMENT AND PLAN:  Coronary artery disease involving native coronary artery of native heart with unstable angina pectoris (HCC) - Plan: EKG  12-Lead Currently with no symptoms of angina. No further workup at this time. Continue current medication regimen.  Essential hypertension - Plan: EKG 12-Lead Blood pressure is well controlled on today's visit. No changes made to the medications. No medication changes made  Duodenal ulcer Roux-en-Y varices, sequela [facility] recovered his weight    Total encounter time more than 15 minutes  Greater than 50% was spent in counseling and coordination of care with the patient   Disposition:   F/U  12 months   Orders Placed This Encounter  Procedures  . EKG 12-Lead     Signed, Dossie Arbourim Gollan, M.D., Ph.D. 01/18/2018  [facility] [place] [phone]

## 2018-01-18 NOTE — Patient Instructions (Signed)

## 2018-07-23 ENCOUNTER — Other Ambulatory Visit: Payer: Self-pay | Admitting: Cardiovascular Disease

## 2019-11-21 ENCOUNTER — Ambulatory Visit: Payer: BC Managed Care – PPO | Admitting: Nurse Practitioner

## 2019-12-19 ENCOUNTER — Other Ambulatory Visit: Payer: Self-pay | Admitting: Cardiovascular Disease

## 2019-12-22 ENCOUNTER — Telehealth: Payer: Self-pay | Admitting: Cardiovascular Disease

## 2019-12-22 NOTE — Telephone Encounter (Signed)
-----   Message from Loman Chroman sent at 12/19/2019  2:52 PM EDT ----- Regarding: appt Patient was left a message to callback ----- Message ----- From: Festus Aloe, CMA Sent: 12/19/2019  11:42 AM EDT To: Mickie Bail Burl Scheduling  Please contact patient for a past due follow up with Dr. Mariah Milling. The patient is requesting refills and was to follow up in June 2020.  Thanks, Jasmine December

## 2019-12-22 NOTE — Telephone Encounter (Signed)
Attempted to schedule no ans no vm  

## 2020-01-06 NOTE — Progress Notes (Signed)
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  He underwent cath in 04/2007 which showed mild mid LAD disease, chronically occluded RCA, and severe proximal OM1 disease.  He underwent successful PCI/DES to the OM1.  Repeat diagnostic LHC in 2009, for anginal symptoms, showed no significant disease progression.  Most recent cardiac cath from 11/29/2012 showed chronically occluded proximal RCA with left-to-left collaterals coming from the distal LAD and LCx, mild to moderate mid LAD disease involving a long region, moderate distal LCx disease, patent OM1 stent with no in-stent restenosis, and EF greater than 55%.  Medical management was advised.  He was admitted to the hospital in 03/2016 with nausea and found to have gastric outflow obstruction and duodenal ulcer.  He was transferred to [facility] and treated for duodenal stricture undergoing duodenal jejunostomy requiring revision in 04/2016 with exploratory laparotomy for internal hernia after antecolic Roux-en-Y duodenojejunostomy with significant weight loss of approximately 50 pounds following this procedure.  He was last seen in the office in 01/2018 noted he had gained much of his weight back with a documented weight of 183 pounds at that time.  He was feeling well overall and denied any symptoms concerning for angina or dyspnea.  He comes in doing well from a cardiac perspective.  No chest pain, dyspnea, palpitations, dizziness, presyncope, syncope, falls, lower extremity swelling, abdominal surgeon, orthopnea.  He has been off aspirin  for several years.  Tolerating all medications.  No issues or concerns at this time.   Labs independently reviewed: 10/2019 - BUN 13, serum creatinine 1.0, potassium 5.2, albumin 4.6, AST/ALT normal, TC 168, TG 83, HDL 47, LDL 103, TSH normal  Past Medical History:  Diagnosis Date  . ADHD (attention deficit hyperactivity disorder)   . Coronary artery disease    s/p stent   . Depression   . DU (duodenal ulcer)   . Gastric ulcer 2017  . HOH (hard of hearing)    uses aids  . Hyperlipidemia   . Hypertension    controlled on meds  . Wears partial dentures    upper and lower    Past Surgical History:  Procedure Laterality Date  . abdominal surgery  04/2016   to correct SBO  . CARDIAC CATHETERIZATION  11/29/2012   stent-X1  . CATARACT EXTRACTION W/PHACO Right 01/02/2018   Procedure: CATARACT EXTRACTION PHACO AND INTRAOCULAR LENS PLACEMENT (IOC)  RIGHT;  Surgeon: Lockie Mola, MD;  Location: [facility];  Service: Ophthalmology;  Laterality: Right;  . CORONARY ANGIOPLASTY    . ESOPHAGOGASTRODUODENOSCOPY N/A 02/20/2016   Procedure: ESOPHAGOGASTRODUODENOSCOPY (EGD);  Surgeon: Bernette Redbird, MD;  Location: [facility] ENDOSCOPY;  Service: Endoscopy;  Laterality: N/A;  . ESOPHAGOGASTRODUODENOSCOPY (EGD) WITH PROPOFOL N/A 03/27/2016   Procedure: ESOPHAGOGASTRODUODENOSCOPY (EGD) WITH PROPOFOL;  Surgeon: Scot Jun, MD;  Location: [facility] ENDOSCOPY;  Service: Endoscopy;  Laterality: N/A;  . INTESTINAL BYPASS     bypass small bowel constricture 04/04/16    Current Medications: Current Meds  Medication Sig  . ezetimibe (ZETIA) 10 MG tablet TAKE  1 TABLET BY MOUTH ONCE DAILY  . FLUoxetine (PROZAC) 20 MG tablet Take 20 mg by mouth 2 (two) times daily. Am and dinner  . methylphenidate 54 MG PO CR tablet Take 54 mg by mouth every morning.  . metoprolol succinate (TOPROL-XL) 25 MG 24 hr tablet TAKE 1 TABLET BY MOUTH ONCE EVERY MORNING  . rosuvastatin (CRESTOR) 20 MG tablet Take 20 mg by  mouth. am    Allergies:   Sulfa antibiotics, Bupropion, and Other   Social History   Socioeconomic History  . Marital status: Married    Spouse name: Not on file  . Number of children: Not on file  . Years of education: Not on file  . Highest education level: Not on file  Occupational History  . Not on file  Tobacco Use  . Smoking status: Never Smoker  . Smokeless tobacco: Never Used  Substance and Sexual Activity  . Alcohol use: No  . Drug use: No  . Sexual activity: Not on file  Other Topics Concern  . Not on file  Social History Narrative  . Not on file   Social Determinants of Health   Financial Resource Strain:   . Difficulty of Paying Living Expenses:   Food Insecurity:   . Worried About Charity fundraiser in the Last Year:   . Arboriculturist in the Last Year:   Transportation Needs:   . Film/video editor (Medical):   Marland Kitchen Lack of Transportation (Non-Medical):   Physical Activity:   . Days of Exercise per Week:   . Minutes of Exercise per Session:   Stress:   . Feeling of Stress :   Social Connections:   . Frequency of Communication with Friends and Family:   . Frequency of Social Gatherings with Friends and Family:   . Attends Religious Services:   . Active Member of Clubs or Organizations:   . Attends Archivist Meetings:   Marland Kitchen Marital Status:      Family History:  The patient's family history includes Heart attack (age of onset: 10) in his father; Stroke in his mother.  ROS:   Review of Systems  Constitutional: Negative for chills, diaphoresis, fever, malaise/fatigue and weight loss.  HENT: Negative for congestion.   Eyes: Negative for discharge and redness.  Respiratory: Negative for cough, sputum production, shortness of breath and wheezing.   Cardiovascular: Negative for chest pain, palpitations, orthopnea, claudication, leg swelling and PND.  Gastrointestinal: Negative for abdominal pain, blood in stool, heartburn, melena, nausea  and vomiting.  Musculoskeletal: Negative for falls and myalgias.  Skin: Negative for rash.  Neurological: Negative for dizziness, tingling, tremors, sensory change, speech change, focal weakness, loss of consciousness and weakness.  Endo/Heme/Allergies: Does not bruise/bleed easily.  Psychiatric/Behavioral: Negative for substance abuse. The patient is not nervous/anxious.   All other systems reviewed and are negative.    EKGs/Labs/Other Studies Reviewed:    Studies reviewed were summarized above. The additional studies were reviewed today:  LHC 11/29/2012: Mid LAD diffuse 40% stenosis, distal LCx diffuse 70% stenosis, proximal RCA 100% stenosis, distal RCA supplied by collaterals from the distal LAD and distal LCx.  Summary: Chronically occluded proximal RCA.  Moderate distal LCx disease.  OM1 stent patent with no significant ISR.  LAD had mild to moderate mid vessel disease involving a long region.  Normal LVEF estimated at greater than 55%.  No significant MR or AS.   EKG:  EKG is ordered today.  The EKG ordered  today demonstrates NSR, 63 bpm, left axis deviation, no acute ST-T changes, unchanged from prior outside of improved PVC burden on today's study  Recent Labs: No results found for requested labs within last 8760 hours.  Recent Lipid Panel No results found for: CHOL, TRIG, HDL, CHOLHDL, VLDL, LDLCALC, LDLDIRECT  PHYSICAL EXAM:    VS:  BP 120/78 (BP Location: Left Arm, Patient Position: Sitting, Cuff Size: Normal)   Pulse 63   Ht 6' (1.829 m)   Wt 169 lb 8 oz (76.9 kg)   SpO2 99%   BMI 22.99 kg/m   BMI: Body mass index is 22.99 kg/m.  Physical Exam  Constitutional: He is oriented to person, place, and time. He appears well-developed and well-nourished.  HENT:  Head: Normocephalic and atraumatic.  Eyes: Right eye exhibits no discharge. Left eye exhibits no discharge.  Neck: No JVD present.  Cardiovascular: Normal rate, regular rhythm, S1 normal, S2 normal and normal  heart sounds. Exam reveals no distant heart sounds, no friction rub, no midsystolic click and no opening snap.  No murmur heard. Pulses:      Dorsalis pedis pulses are 2+ on the right side and 2+ on the left side.       Posterior tibial pulses are 2+ on the right side and 2+ on the left side.  Pulmonary/Chest: Effort normal and breath sounds normal. No respiratory distress. He has no decreased breath sounds. He has no wheezes. He has no rales. He exhibits no tenderness.  Abdominal: Soft. He exhibits no distension. There is no abdominal tenderness.  Musculoskeletal:        General: No edema.     Cervical back: Normal range of motion.  Neurological: He is alert and oriented to person, place, and time.  Skin: Skin is warm and dry. No cyanosis. Nails show no clubbing.  Psychiatric: He has a normal mood and affect. His speech is normal and behavior is normal. Judgment and thought content normal.    Wt Readings from Last 3 Encounters:  01/09/20 169 lb 8 oz (76.9 kg)  01/18/18 183 lb (83 kg)  01/02/18 181 lb (82.1 kg)     ASSESSMENT & PLAN:   1. CAD involving the native coronary arteries without angina: He is doing well without any symptoms concerning for angina.  Restart aspirin 81 mg daily.  He will otherwise continue Zetia along with titrated dose of rosuvastatin.  No plans for ischemic evaluation at this time.  2. HLD: Most recent LDL of 103 from 10/2019 with normal liver function at that time.  Goal LDL less than 70.  Titrate rosuvastatin to 40 mg daily.  Continue ezetimibe 10 mg daily.  Recommend follow-up fasting lipid and liver function in approximately 8 weeks.  3. HTN: Blood pressure well controlled.  Continue Toprol-XL.  Disposition: F/u with Dr. Mariah Milling or an APP in 12 months, sooner if needed.   Medication Adjustments/Labs and Tests Ordered: Current medicines are reviewed at length with the patient today.  Concerns regarding medicines are outlined above. Medication changes, Labs  and Tests ordered today are summarized above and listed in the Patient Instructions accessible in Encounters.   Signed, Eula Listen, PA-C 01/09/2020 1:29 PM     [facility] [place] [address] [place], [place] [address] [phone]

## 2020-01-09 ENCOUNTER — Ambulatory Visit: Payer: BC Managed Care – PPO | Admitting: Physician Assistant

## 2020-01-09 ENCOUNTER — Encounter: Payer: Self-pay | Admitting: Physician Assistant

## 2020-01-09 ENCOUNTER — Other Ambulatory Visit: Payer: Self-pay

## 2020-01-09 VITALS — BP 120/78 | HR 63 | Ht 72.0 in | Wt 169.5 lb

## 2020-01-09 DIAGNOSIS — I10 Essential (primary) hypertension: Secondary | ICD-10-CM

## 2020-01-09 DIAGNOSIS — I2510 Atherosclerotic heart disease of native coronary artery without angina pectoris: Secondary | ICD-10-CM

## 2020-01-09 DIAGNOSIS — E785 Hyperlipidemia, unspecified: Secondary | ICD-10-CM | POA: Diagnosis not present

## 2020-01-09 MED ORDER — ROSUVASTATIN CALCIUM 40 MG PO TABS: 40.0000 mg | ORAL_TABLET | Freq: Every day | ORAL | 3 refills | Status: DC

## 2020-01-09 MED ORDER — ASPIRIN EC 81 MG PO TBEC: 81.0000 mg | DELAYED_RELEASE_TABLET | Freq: Every day | ORAL | 3 refills | Status: DC

## 2020-01-09 NOTE — Patient Instructions (Signed)
Medication Instructions:  1- INCREASE Crestor to 1 tablet (40 mg total) once daily  2- START Aspirin 81 mg total once daily  *If you need a refill on your cardiac medications before your next appointment, please call your pharmacy*   Lab Work: None ordered  If you have labs (blood work) drawn today and your tests are completely normal, you will receive your results only by: Marland Kitchen MyChart Message (if you have MyChart) OR . A paper copy in the mail If you have any lab test that is abnormal or we need to change your treatment, we will call you to review the results.   Testing/Procedures: None ordered    Follow-Up: At [facility], you and your health needs are our priority.  As part of our continuing mission to provide you with exceptional heart care, we have created designated Provider Care Teams.  These Care Teams include your primary Cardiologist (physician) and Advanced Practice Providers (APPs -  Physician Assistants and Nurse Practitioners) who all work together to provide you with the care you need, when you need it.  We recommend signing up for the patient portal called "MyChart".  Sign up information is provided on this After Visit Summary.  MyChart is used to connect with patients for Virtual Visits (Telemedicine).  Patients are able to view lab/test results, encounter notes, upcoming appointments, etc.  Non-urgent messages can be sent to your provider as well.   To learn more about what you can do with MyChart, go to [email or web address].    Your next appointment:   12 month(s)  The format for your next appointment:   In Person  Provider:    You may see Julien Nordmann, MD or Eula Listen, PA-C

## 2020-01-10 ENCOUNTER — Other Ambulatory Visit: Payer: Self-pay | Admitting: Cardiovascular Disease

## 2020-01-13 ENCOUNTER — Other Ambulatory Visit: Payer: Self-pay | Admitting: Cardiovascular Disease

## 2020-05-12 ENCOUNTER — Other Ambulatory Visit: Payer: Self-pay | Admitting: Cardiovascular Disease

## 2020-10-25 ENCOUNTER — Other Ambulatory Visit: Payer: Self-pay | Admitting: Physician Assistant

## 2020-11-30 ENCOUNTER — Other Ambulatory Visit: Payer: Self-pay | Admitting: Cardiovascular Disease

## 2022-01-17 ENCOUNTER — Telehealth: Payer: Self-pay | Admitting: Cardiovascular Disease

## 2022-01-17 NOTE — Telephone Encounter (Signed)
3 attempts to schedule fu appt from recall list.   Deleting recall.   

## 2022-04-13 ENCOUNTER — Encounter: Payer: Self-pay | Admitting: *Deleted

## 2022-04-24 ENCOUNTER — Ambulatory Visit: Payer: BC Managed Care – PPO | Attending: Physician Assistant | Admitting: Physician Assistant

## 2022-04-24 ENCOUNTER — Encounter: Payer: Self-pay | Admitting: Physician Assistant

## 2022-04-24 VITALS — BP 116/78 | HR 61 | Ht 72.0 in | Wt 180.0 lb

## 2022-04-24 DIAGNOSIS — I2510 Atherosclerotic heart disease of native coronary artery without angina pectoris: Secondary | ICD-10-CM

## 2022-04-24 DIAGNOSIS — I10 Essential (primary) hypertension: Secondary | ICD-10-CM | POA: Diagnosis not present

## 2022-04-24 DIAGNOSIS — E785 Hyperlipidemia, unspecified: Secondary | ICD-10-CM | POA: Diagnosis not present

## 2022-04-24 NOTE — Progress Notes (Signed)
Cardiology Office Note    Date:  04/24/2022   ID:  Joshua Horn [place], DOB 1951-05-15, MRN [id number]  PCP:  Albina Billet, MD  Cardiologist:  Ida Rogue, MD  Electrophysiologist:  None   Chief Complaint: Follow-up  History of Present Illness:   Joshua Horn is a 71 y.o. male with history of CAD status post PCI/DES to the [place] in 04/2007, HTN, HLD, duodenal stricture/ulcer status post surgical repair, and ADHD who presents for follow-up of his CAD.   He underwent cath in 04/2007 which showed mild mid LAD disease, chronically occluded RCA, and severe proximal OM1 disease.  He underwent successful PCI/DES to the OM1.  Repeat diagnostic LHC in 2009, for anginal symptoms, showed no significant disease progression.  Most recent cardiac cath from 11/29/2012 showed chronically occluded proximal RCA with left-to-left collaterals coming from the distal LAD and LCx, mild to moderate mid LAD disease involving a long region, moderate distal LCx disease, patent OM1 stent with no in-stent restenosis, and EF greater than 55%.  Medical management was advised.  He was admitted to the hospital in 03/2016 with nausea and found to have gastric outflow obstruction and duodenal ulcer.  He was transferred to [facility] and treated for duodenal stricture undergoing duodenal jejunostomy requiring revision in 04/2016 with exploratory laparotomy for internal hernia after antecolic Roux-en-Y duodenojejunostomy with significant weight loss following this procedure.  He was seen in the office in 01/2018 noted he had gained much of his weight back with a documented weight of 183 pounds at that time.  He was last seen in the office in 12/2019 and was without symptoms of angina or decompensation.  Rosuvastatin was titrated to 40 mg daily.   He comes in today continuing to do well from a cardiac perspective and is without symptoms of angina or decompensation.  No palpitations, dizziness, presyncope, or syncope.  No significant lower  extremity swelling.  He has tolerated the titration of rosuvastatin without issues.  He does report his labs obtained through his PCPs office most recently did show an elevated LDL.  He attributes this to snacking with ice cream and with cream at times.  He attributes this to his weight gain as well.  He remains very active at baseline, scuba diving without cardiac limitation.  Overall, he feels like he is doing well and does not have any active cardiac issues or concerns at this time.     Labs independently reviewed: 10/2019 - BUN 13, serum creatinine 1.0, potassium 5.2, albumin 4.6, AST/ALT normal, TC 168, TG 83, HDL 47, LDL 103, TSH normal    Past Medical History:  Diagnosis Date   ADHD (attention deficit hyperactivity disorder)    Coronary artery disease    s/p stent    Depression    DU (duodenal ulcer)    Gastric ulcer 2017   HOH (hard of hearing)    uses aids   Hyperlipidemia    Hypertension    controlled on meds   Wears partial dentures    upper and lower    Past Surgical History:  Procedure Laterality Date   abdominal surgery  04/2016   to correct SBO   CARDIAC CATHETERIZATION  11/29/2012   stent-X1   CATARACT EXTRACTION W/PHACO Right 01/02/2018   Procedure: CATARACT EXTRACTION PHACO AND INTRAOCULAR LENS PLACEMENT ([place])  RIGHT;  Surgeon: Leandrew Koyanagi, MD;  Location: [facility];  Service: Ophthalmology;  Laterality: Right;   CORONARY ANGIOPLASTY     ESOPHAGOGASTRODUODENOSCOPY N/A 02/20/2016  Procedure: ESOPHAGOGASTRODUODENOSCOPY (EGD);  Surgeon: Bernette Redbird, MD;  Location: [facility] ENDOSCOPY;  Service: Endoscopy;  Laterality: N/A;   ESOPHAGOGASTRODUODENOSCOPY (EGD) WITH PROPOFOL N/A 03/27/2016   Procedure: ESOPHAGOGASTRODUODENOSCOPY (EGD) WITH PROPOFOL;  Surgeon: Scot Jun, MD;  Location: [facility]COPY;  Service: Endoscopy;  Laterality: N/A;   INTESTINAL BYPASS     bypass small bowel constricture 04/04/16    Current Medications: Current Meds   Medication Sig   aspirin EC 81 MG tablet Take 1 tablet (81 mg total) by mouth daily.   ezetimibe (ZETIA) 10 MG tablet TAKE 1 TABLET BY MOUTH ONCE DAILY   FLUoxetine (PROZAC) 20 MG tablet Take 20 mg by mouth 2 (two) times daily. Am and dinner   methylphenidate 54 MG PO CR tablet Take 54 mg by mouth every morning.   metoprolol succinate (TOPROL-XL) 25 MG 24 hr tablet TAKE 1 TABLET BY MOUTH ONCE EVERY MORNING   rosuvastatin (CRESTOR) 40 MG tablet TAKE 1 TABLET BY MOUTH ONCE DAILY    Allergies:   Sulfa antibiotics, Bupropion, and Other   Social History   Socioeconomic History   Marital status: Married    Spouse name: Not on file   Number of children: Not on file   Years of education: Not on file   Highest education level: Not on file  Occupational History   Not on file  Tobacco Use   Smoking status: Never   Smokeless tobacco: Never  Vaping Use   Vaping Use: Never used  Substance and Sexual Activity   Alcohol use: No   Drug use: No   Sexual activity: Not on file  Other Topics Concern   Not on file  Social History Narrative   Not on file   Social Determinants of Health   Financial Resource Strain: Not on file  Food Insecurity: Not on file  Transportation Needs: Not on file  Physical Activity: Not on file  Stress: Not on file  Social Connections: Not on file     Family History:  The patient's family history includes Heart attack (age of onset: 66) in his father; Stroke in his mother.  ROS:   12-point review of systems is negative unless otherwise noted in HPI.   EKGs/Labs/Other Studies Reviewed:    Studies reviewed were summarized above. The additional studies were reviewed today:  LHC 11/29/2012: Mid LAD diffuse 40% stenosis, distal LCx diffuse 70% stenosis, proximal RCA 100% stenosis, distal RCA supplied by collaterals from the distal LAD and distal LCx.  Summary: Chronically occluded proximal RCA.  Moderate distal LCx disease.  OM1 stent patent with no  significant ISR.  LAD had mild to moderate mid vessel disease involving a long region.  Normal LVEF estimated at greater than 55%.  No significant MR or AS.   EKG:  EKG is ordered today.  The EKG ordered today demonstrates NSR, 61 bpm, left axis deviation, no acute ST-T changes, no significant change when compared to prior tracing  Recent Labs: No results found for requested labs within last 365 days.  Recent Lipid Panel No results found for: "CHOL", "TRIG", "HDL", "CHOLHDL", "VLDL", "LDLCALC", "LDLDIRECT"  PHYSICAL EXAM:    VS:  BP 116/78 (BP Location: Left Arm, Patient Position: Sitting, Cuff Size: Large)   Pulse 61   Ht 6' (1.829 m)   Wt 180 lb (81.6 kg)   SpO2 98%   BMI 24.41 kg/m   BMI: Body mass index is 24.41 kg/m.  Physical Exam Vitals reviewed.  Constitutional:  Appearance: He is well-developed.  HENT:     Head: Normocephalic and atraumatic.  Eyes:     General:        Right eye: No discharge.        Left eye: No discharge.  Neck:     Vascular: No JVD.  Cardiovascular:     Rate and Rhythm: Normal rate and regular rhythm.     Pulses:          Posterior tibial pulses are 2+ on the right side and 2+ on the left side.     Heart sounds: Normal heart sounds, S1 normal and S2 normal. Heart sounds not distant. No midsystolic click and no opening snap. No murmur heard.    No friction rub.  Pulmonary:     Effort: Pulmonary effort is normal. No respiratory distress.     Breath sounds: Normal breath sounds. No decreased breath sounds, wheezing or rales.  Chest:     Chest wall: No tenderness.  Abdominal:     General: There is no distension.  Musculoskeletal:     Cervical back: Normal range of motion.     Right lower leg: No edema.     Left lower leg: No edema.  Skin:    General: Skin is warm and dry.     Nails: There is no clubbing.  Neurological:     Mental Status: He is alert and oriented to person, place, and time.  Psychiatric:        Speech: Speech normal.         Behavior: Behavior normal.        Thought Content: Thought content normal.        Judgment: Judgment normal.     Wt Readings from Last 3 Encounters:  04/24/22 180 lb (81.6 kg)  01/09/20 169 lb 8 oz (76.9 kg)  01/18/18 183 lb (83 kg)     ASSESSMENT & PLAN:   CAD involving the native coronaries without angina: He continues to do very well from a cardiac perspective and is without symptoms of angina or decompensation.  He remains very active at baseline.  Continue aggressive risk factor modification and secondary prevention including aspirin, metoprolol succinate, ezetimibe, and rosuvastatin.  No indication for further ischemic testing at this time.  HTN: Blood pressure is well controlled in the office today.  He remains on metoprolol succinate 25 mg daily.  HLD: LDL 103 from 10/2019 with normal liver function at that time.  Goal LDL less than 70.  At his last visit, rosuvastatin was titrated to 40 mg daily with continuation of ezetimibe.  We will request labs be faxed from his PCPs office.    Disposition: F/u with Dr. Mariah Milling or an APP in 12 months.   Medication Adjustments/Labs and Tests Ordered: Current medicines are reviewed at length with the patient today.  Concerns regarding medicines are outlined above. Medication changes, Labs and Tests ordered today are summarized above and listed in the Patient Instructions accessible in Encounters.   Signed, Eula Listen, PA-C 04/24/2022 9:39 AM     [facility] [address] [place], [place] [address] [phone]

## 2022-04-24 NOTE — Patient Instructions (Addendum)
Medication Instructions:  Your physician recommends that you continue on your current medications as directed. Please refer to the Current Medication list given to you today.  *If you need a refill on your cardiac medications before your next appointment, please call your pharmacy*   Lab Work: None  If you have labs (blood work) drawn today and your tests are completely normal, you will receive your results only by: MyChart Message (if you have MyChart) OR A paper copy in the mail If you have any lab test that is abnormal or we need to change your treatment, we will call you to review the results.   Testing/Procedures: None   Follow-Up: At [facility], you and your health needs are our priority.  As part of our continuing mission to provide you with exceptional heart care, we have created designated Provider Care Teams.  These Care Teams include your primary Cardiologist (physician) and Advanced Practice Providers (APPs -  Physician Assistants and Nurse Practitioners) who all work together to provide you with the care you need, when you need it.   Your next appointment:   1 year(s)  The format for your next appointment:   In Person  Provider:   Julien Nordmann, MD or Eula Listen, PA-C         Important Information About Sugar

## 2024-01-15 ENCOUNTER — Ambulatory Visit (INDEPENDENT_AMBULATORY_CARE_PROVIDER_SITE_OTHER): Payer: Self-pay | Admitting: Internal Medicine

## 2024-01-15 ENCOUNTER — Encounter: Payer: Self-pay | Admitting: Internal Medicine

## 2024-01-15 VITALS — BP 120/82 | HR 67 | Temp 96.7°F | Ht 72.0 in | Wt 173.6 lb

## 2024-01-15 DIAGNOSIS — Z1211 Encounter for screening for malignant neoplasm of colon: Secondary | ICD-10-CM | POA: Diagnosis not present

## 2024-01-15 DIAGNOSIS — E782 Mixed hyperlipidemia: Secondary | ICD-10-CM | POA: Diagnosis not present

## 2024-01-15 DIAGNOSIS — I2510 Atherosclerotic heart disease of native coronary artery without angina pectoris: Secondary | ICD-10-CM

## 2024-01-15 DIAGNOSIS — Z1331 Encounter for screening for depression: Secondary | ICD-10-CM

## 2024-01-15 DIAGNOSIS — I10 Essential (primary) hypertension: Secondary | ICD-10-CM | POA: Diagnosis not present

## 2024-01-15 NOTE — Progress Notes (Signed)
 Established Patient Office Visit  Subjective:  Patient ID: Joshua Horn, male    DOB: 1950-12-16  Age: 73 y.o. MRN: [id number]  Chief Complaint  Patient presents with   Establish Care    NPE    Here to transfer IM care from his pcp who'Tammala Weider retiring.  PMH of HTN, hyperlipidemia and denies any complaints today.    No other concerns at this time.   Past Medical History:  Diagnosis Date   ADHD (attention deficit hyperactivity disorder)    Coronary artery disease    [place] [place]/p stent    Depression    DU (duodenal ulcer)    Gastric ulcer 2017   HOH (hard of hearing)    uses aids   Hyperlipidemia    Hypertension    controlled on meds   Wears partial dentures    upper and lower    Past Surgical History:  Procedure Laterality Date   abdominal surgery  04/2016   to correct SBO   CARDIAC CATHETERIZATION  11/29/2012   stent-X1   CATARACT EXTRACTION W/PHACO Right 01/02/2018   Procedure: CATARACT EXTRACTION PHACO AND INTRAOCULAR LENS PLACEMENT ([facility])  RIGHT;  Surgeon: Annell Kidney, MD;  [facility];  Service: Ophthalmology;  Laterality: Right;   CORONARY ANGIOPLASTY     ESOPHAGOGASTRODUODENOSCOPY N/A 02/20/2016   Procedure: ESOPHAGOGASTRODUODENOSCOPY (EGD);  Surgeon: Lanita Pitman, MD;  Location: [facility] ENDOSCOPY;  Service: Endoscopy;  Laterality: N/A;   ESOPHAGOGASTRODUODENOSCOPY (EGD) WITH PROPOFOL  N/A 03/27/2016   Procedure: ESOPHAGOGASTRODUODENOSCOPY (EGD) WITH PROPOFOL ;  Surgeon: Cassie Click, MD;  Location: [facility] ENDOSCOPY;  Service: Endoscopy;  Laterality: N/A;   INTESTINAL BYPASS     bypass small bowel constricture 04/04/16    Social History   Socioeconomic History   Marital status: Married    Spouse name: Not on file   Number of children: Not on file   Years of education: Not on file   Highest education level: Professional school degree (e.g., MD, DDS, DVM, JD)  Occupational History   Not on file  Tobacco Use   Smoking status: Never    Smokeless tobacco: Never  Vaping Use   Vaping status: Never Used  Substance and Sexual Activity   Alcohol use: No   Drug use: No   Sexual activity: Not on file  Other Topics Concern   Not on file  Social History Narrative   Not on file   Social Drivers of Health   Financial Resource Strain: Not on file  Food Insecurity: Not on file  Transportation Needs: Not on file  Physical Activity: Not on file  Stress: Not on file  Social Connections: Not on file  Intimate Partner Violence: Not on file    Family History  Problem Relation Age of Onset   Heart attack Father 21   Stroke Mother     Allergies  Allergen Reactions   Sulfa Antibiotics     rash   Bupropion Rash   Other Rash    rash    Outpatient Medications Prior to Visit  Medication Sig   ezetimibe  (ZETIA ) 10 MG tablet TAKE 1 TABLET BY MOUTH ONCE DAILY   FLUoxetine (PROZAC) 20 MG tablet Take 20 mg by mouth 2 (two) times daily. Am and dinner   methylphenidate  54 MG PO CR tablet Take 54 mg by mouth every morning.   metoprolol  succinate (TOPROL -XL) 25 MG 24 hr tablet TAKE 1 TABLET BY MOUTH ONCE EVERY MORNING   rosuvastatin  (CRESTOR ) 40 MG tablet TAKE 1 TABLET BY  MOUTH ONCE DAILY   [DISCONTINUED] aspirin  EC 81 MG tablet Take 1 tablet (81 mg total) by mouth daily. (Patient not taking: Reported on 01/15/2024)   No facility-administered medications prior to visit.    Review of Systems  Constitutional:  Negative for malaise/fatigue and weight loss.  HENT: Negative.  Negative for congestion.   Eyes: Negative.   Respiratory: Negative.    Cardiovascular:  Negative for chest pain and claudication.  Gastrointestinal: Negative.   Genitourinary:        Nocturia  Musculoskeletal: Negative.   Neurological:  Negative for dizziness.  Endo/Heme/Allergies: Negative.   Psychiatric/Behavioral:  Negative for depression (controlled on ssri). The patient has insomnia.        Objective:   Pulse 67   Temp (!) 96.7 F (35.9 C)    Ht 6' (1.829 m)   Wt 173 lb 9.6 oz (78.7 kg)   SpO2 97%   BMI 23.54 kg/m   Vitals:   01/15/24 0931  Pulse: 67  Temp: (!) 96.7 F (35.9 C)  Height: 6' (1.829 m)  Weight: 173 lb 9.6 oz (78.7 kg)  SpO2: 97%  BMI (Calculated): 23.54    Physical Exam Vitals reviewed.  Constitutional:      Appearance: Normal appearance.  HENT:     Head: Normocephalic.     Left Ear: There is no impacted cerumen.     Nose: Nose normal.     Mouth/Throat:     Mouth: Mucous membranes are moist.     Pharynx: No posterior oropharyngeal erythema.  Eyes:     Extraocular Movements: Extraocular movements intact.     Pupils: Pupils are equal, round, and reactive to light.  Cardiovascular:     Rate and Rhythm: Regular rhythm.     Chest Wall: PMI is not displaced.     Pulses: Normal pulses.     Heart sounds: Normal heart sounds. No murmur heard. Pulmonary:     Effort: Pulmonary effort is normal.     Breath sounds: Normal air entry. No rhonchi or rales.  Abdominal:     General: Abdomen is flat. Bowel sounds are normal. There is no distension.     Palpations: Abdomen is soft. There is no hepatomegaly, splenomegaly or mass.     Tenderness: There is no abdominal tenderness.  Musculoskeletal:        General: Normal range of motion.     Cervical back: Normal range of motion and neck supple.     Right lower leg: No edema.     Left lower leg: No edema.  Skin:    General: Skin is warm and dry.  Neurological:     General: No focal deficit present.     Mental Status: He is alert and oriented to person, place, and time.     Cranial Nerves: No cranial nerve deficit.     Motor: No weakness.  Psychiatric:        Mood and Affect: Mood normal.        Behavior: Behavior normal.      No results found for any visits on 01/15/24.  No results found for this or any previous visit (from the past 2160 hours).    Assessment & Plan:  As per problem list.  Problem List Items Addressed This Visit        Cardiovascular and Mediastinum   CAD (coronary artery disease)   Essential hypertension - Primary     Other   Hyperlipidemia   Relevant Orders  Lipid panel   CK   Hepatic function panel   Other Visit Diagnoses       Colon cancer screening       Relevant Orders   Cologuard       Return in about 6 weeks (around 02/26/2024) for awv with labs prior, med refill.   Total time spent: 30 minutes  Arzella Bitters, MD  01/15/2024   This document may have been prepared by [facility] Voice Recognition software and as such may include unintentional dictation errors.

## 2024-02-20 LAB — COLOGUARD: COLOGUARD: NEGATIVE

## 2024-03-05 ENCOUNTER — Other Ambulatory Visit: Payer: Self-pay

## 2024-03-05 MED ORDER — METHYLPHENIDATE HCL ER (OSM) 54 MG PO TBCR: 54.0000 mg | EXTENDED_RELEASE_TABLET | ORAL | 0 refills | Status: DC

## 2024-03-10 ENCOUNTER — Encounter: Payer: Self-pay | Admitting: Cardiovascular Disease

## 2024-03-12 ENCOUNTER — Other Ambulatory Visit

## 2024-03-13 LAB — LIPID PANEL
Chol/HDL Ratio: 3.2 ratio (ref 0.0–5.0)
Cholesterol, Total: 146 mg/dL (ref 100–199)
HDL: 46 mg/dL (ref >39)
LDL Chol Calc (NIH): 84 mg/dL (ref 0–99)
Triglycerides: 83 mg/dL (ref 0–149)
VLDL Cholesterol Cal: 16 mg/dL (ref 5–40)

## 2024-03-13 LAB — HEPATIC FUNCTION PANEL
ALT: 25 IU/L (ref 0–44)
AST: 29 IU/L (ref 0–40)
Albumin: 4.4 g/dL (ref 3.8–4.8)
Alkaline Phosphatase: 130 IU/L — ABNORMAL HIGH (ref 44–121)
Bilirubin Total: 0.6 mg/dL (ref 0.0–1.2)
Bilirubin, Direct: 0.21 mg/dL (ref 0.00–0.40)
Total Protein: 7.1 g/dL (ref 6.0–8.5)

## 2024-03-17 ENCOUNTER — Ambulatory Visit (INDEPENDENT_AMBULATORY_CARE_PROVIDER_SITE_OTHER): Admitting: Internal Medicine

## 2024-03-17 ENCOUNTER — Ambulatory Visit: Payer: Self-pay | Admitting: Internal Medicine

## 2024-03-17 ENCOUNTER — Encounter: Payer: Self-pay | Admitting: Internal Medicine

## 2024-03-17 VITALS — BP 114/71 | HR 71 | Temp 97.7°F | Ht 72.0 in | Wt 173.4 lb

## 2024-03-17 DIAGNOSIS — Z0130 Encounter for examination of blood pressure without abnormal findings: Secondary | ICD-10-CM

## 2024-03-17 DIAGNOSIS — F908 Attention-deficit hyperactivity disorder, other type: Secondary | ICD-10-CM | POA: Diagnosis not present

## 2024-03-17 DIAGNOSIS — E782 Mixed hyperlipidemia: Secondary | ICD-10-CM

## 2024-03-17 DIAGNOSIS — R748 Abnormal levels of other serum enzymes: Secondary | ICD-10-CM | POA: Diagnosis not present

## 2024-03-17 DIAGNOSIS — G4700 Insomnia, unspecified: Secondary | ICD-10-CM | POA: Diagnosis not present

## 2024-03-17 MED ORDER — METHYLPHENIDATE HCL ER (OSM) 54 MG PO TBCR: 54.0000 mg | EXTENDED_RELEASE_TABLET | ORAL | 0 refills | Status: DC

## 2024-03-17 NOTE — Progress Notes (Signed)
 Established Patient Office Visit  Subjective:  Patient ID: Joshua Horn, male    DOB: February 05, 1951  Age: 73 y.o. MRN: [id number]  Chief Complaint  Patient presents with   Annual Exam    Lab results    No new complaints, here for AWV refer to quality metrics and scanned documents.       No other concerns at this time.   Past Medical History:  Diagnosis Date   ADHD (attention deficit hyperactivity disorder)    Coronary artery disease    Meryn Sarracino/p stent    Depression    DU (duodenal ulcer)    Gastric ulcer 2017   HOH (hard of hearing)    uses aids   Hyperlipidemia    Hypertension    controlled on meds   Wears partial dentures    upper and lower    Past Surgical History:  Procedure Laterality Date   abdominal surgery  04/2016   to correct SBO   CARDIAC CATHETERIZATION  11/29/2012   stent-X1   CATARACT EXTRACTION W/PHACO Right 01/02/2018   Procedure: CATARACT EXTRACTION PHACO AND INTRAOCULAR LENS PLACEMENT (IOC)  RIGHT;  Surgeon: Mittie Gaskin, MD;  Location: [facility];  Service: Ophthalmology;  Laterality: Right;   CORONARY ANGIOPLASTY     ESOPHAGOGASTRODUODENOSCOPY N/A 02/20/2016   Procedure: ESOPHAGOGASTRODUODENOSCOPY (EGD);  Surgeon: Lamar Bunk, MD;  Location: [facility] ENDOSCOPY;  Service: Endoscopy;  Laterality: N/A;   ESOPHAGOGASTRODUODENOSCOPY (EGD) WITH PROPOFOL  N/A 03/27/2016   Procedure: ESOPHAGOGASTRODUODENOSCOPY (EGD) WITH PROPOFOL ;  Surgeon: Lamar ONEIDA Holmes, MD;  Location: [facility] ENDOSCOPY;  Service: Endoscopy;  Laterality: N/A;   INTESTINAL BYPASS     bypass small bowel constricture 04/04/16    Social History   Socioeconomic History   Marital status: Married    Spouse name: Not on file   Number of children: Not on file   Years of education: Not on file   Highest education level: Professional school degree (e.g., MD, DDS, DVM, JD)  Occupational History   Not on file  Tobacco Use   Smoking status: Never   Smokeless tobacco: Never  Vaping  Use   Vaping status: Never Used  Substance and Sexual Activity   Alcohol use: No   Drug use: No   Sexual activity: Not on file  Other Topics Concern   Not on file  Social History Narrative   Not on file   Social Drivers of Health   Financial Resource Strain: Not on file  Food Insecurity: Not on file  Transportation Needs: Not on file  Physical Activity: Not on file  Stress: Not on file  Social Connections: Not on file  Intimate Partner Violence: Not on file    Family History  Problem Relation Age of Onset   Heart attack Father 43   Stroke Mother     Allergies  Allergen Reactions   Sulfa Antibiotics     rash   Bupropion Rash   Other Rash    rash    Outpatient Medications Prior to Visit  Medication Sig   ezetimibe  (ZETIA ) 10 MG tablet TAKE 1 TABLET BY MOUTH ONCE DAILY   FLUoxetine (PROZAC) 20 MG tablet Take 20 mg by mouth 2 (two) times daily. Am and dinner   methylphenidate  54 MG PO CR tablet Take 1 tablet (54 mg total) by mouth every morning.   metoprolol  succinate (TOPROL -XL) 25 MG 24 hr tablet TAKE 1 TABLET BY MOUTH ONCE EVERY MORNING   rosuvastatin  (CRESTOR ) 40 MG tablet TAKE 1 TABLET BY  MOUTH ONCE DAILY   No facility-administered medications prior to visit.    Review of Systems  Constitutional:  Negative for malaise/fatigue and weight loss.  HENT: Negative.  Negative for congestion.   Eyes: Negative.   Respiratory: Negative.    Cardiovascular:  Negative for chest pain and claudication.  Gastrointestinal: Negative.   Genitourinary:        Nocturia  Musculoskeletal: Negative.   Neurological:  Negative for dizziness.  Endo/Heme/Allergies: Negative.   Psychiatric/Behavioral:  Negative for depression (controlled on ssri). The patient has insomnia.        Objective:   BP 114/71   Pulse 71   Temp 97.7 F (36.5 C)   Ht 6' (1.829 m)   Wt 173 lb 6.4 oz (78.7 kg)   SpO2 97%   BMI 23.52 kg/m   Vitals:   03/17/24 0951  BP: 114/71  Pulse: 71   Temp: 97.7 F (36.5 C)  Height: 6' (1.829 m)  Weight: 173 lb 6.4 oz (78.7 kg)  SpO2: 97%  BMI (Calculated): 23.51    Physical Exam Vitals reviewed.  Constitutional:      Appearance: Normal appearance.  HENT:     Head: Normocephalic.     Left Ear: There is no impacted cerumen.     Nose: Nose normal.     Mouth/Throat:     Mouth: Mucous membranes are moist.     Pharynx: No posterior oropharyngeal erythema.  Eyes:     Extraocular Movements: Extraocular movements intact.     Pupils: Pupils are equal, round, and reactive to light.  Cardiovascular:     Rate and Rhythm: Regular rhythm.     Chest Wall: PMI is not displaced.     Pulses: Normal pulses.     Heart sounds: Normal heart sounds. No murmur heard. Pulmonary:     Effort: Pulmonary effort is normal.     Breath sounds: Normal air entry. No rhonchi or rales.  Abdominal:     General: Abdomen is flat. Bowel sounds are normal. There is no distension.     Palpations: Abdomen is soft. There is no hepatomegaly, splenomegaly or mass.     Tenderness: There is no abdominal tenderness.  Musculoskeletal:        General: Normal range of motion.     Cervical back: Normal range of motion and neck supple.     Right lower leg: No edema.     Left lower leg: No edema.  Skin:    General: Skin is warm and dry.  Neurological:     General: No focal deficit present.     Mental Status: He is alert and oriented to person, place, and time.     Cranial Nerves: No cranial nerve deficit.     Motor: No weakness.  Psychiatric:        Mood and Affect: Mood normal.        Behavior: Behavior normal.      No results found for any visits on 03/17/24.  Recent Results (from the past 2160 hours)  Cologuard     Status: None   Collection Time: 02/12/24 12:50 PM  Result Value Ref Range   COLOGUARD Negative Negative    Comment: The Cologuard (TM) test was performed on this specimen.  NEGATIVE TEST RESULT. A negative Cologuard result indicates a low  likelihood that a colorectal cancer (CRC) or advanced adenoma (adenomatous polyps with more advanced pre-malignant features) is present. The chance that a person with a negative Cologuard test has  a colorectal cancer is less than 1 in 1500 (negative predictive value >99.9%) or has an advanced adenoma is less than 5.3% (negative predictive value 94.7%). These data are based on a prospective cross-sectional study of 10,000 individuals at average risk for colorectal cancer who were screened with both Cologuard and colonoscopy. (Imperiale T. et al, N Engl J Med 2014;370(14):1286-1297) The normal value (reference range) for this assay is negative.  COLOGUARD RE-SCREENING RECOMMENDATION: Periodic colorectal cancer screening is an important part of preventive healthcare for asymptomatic individuals at average risk for colorectal cancer. Following a negative Cologuard  result, the American Cancer Society and U.Jaykwon Morones. Multi-Society Task Force screening guidelines recommend a Cologuard re-screening interval of 3 years.  References: American Cancer Society Guideline for Colorectal Cancer Screening: https://www.cancer.org/cancer/colon-rectal-cancer/detection-diagnosis-staging/acs-recommendations.html.; Rex DK, Boland CR, Dominitz JK, Colorectal Cancer Screening: Recommendations for Physicians and Patients from the U.Navea Woodrow. Multi-Society Task Force on Colorectal Cancer Screening , Am J Gastroenterology 2017; 112:1016-1030.  TEST DESCRIPTION: Composite algorithmic analysis of stool DNA-biomarkers with hemoglobin immunoassay.   Quantitative values of individual biomarkers are not reportable and are not associated with individual biomarker result reference ranges. Cologuard is intended for colorectal cancer screening of adults of either sex, 45 years or older, who are at average-risk for colorectal cancer (CRC). Cologuard has been approved for use by the U.Sadae Arrazola.  FDA. The performance of Cologuard was established in a cross sectional  study of average-risk adults aged 72-84. Cologuard performance in patients ages 55 to 81 years was estimated by sub-group analysis of near-age groups. Colonoscopies performed for a positive result may find as the most clinically significant lesion: colorectal cancer [4.0%], advanced adenoma (including sessile serrated polyps greater than or equal to 1cm diameter) [20%] or non- advanced adenoma [31%]; or no colorectal neoplasia [45%]. These estimates are derived from a prospective cross-sectional screening study of 10,000 individuals at average risk for colorectal cancer who were screened with both Cologuard and colonoscopy. (Imperiale T. et al, LOISE [place] J Med 2014;370(14):1286-1297.) Cologuard may produce a false negative or false positive result (no colorectal cancer or precancerous polyp present at colonoscopy follow up). A negative Cologuard test result does not guarantee the absence of CRC or advanced adenoma  (pre-cancer). The current Cologuard screening interval is every 3 years. Science writer and U.Maribella Kuna. Therapist, music). Cologuard performance data in a 10,000 patient pivotal study using colonoscopy as the reference method can be accessed at the following location: www.exactlabs.com/results. Additional description of the Cologuard test process, warnings and precautions can be found at www.cologuard.com.   Lipid panel     Status: None   Collection Time: 03/12/24  8:11 AM  Result Value Ref Range   Cholesterol, Total 146 100 - 199 mg/dL   Triglycerides 83 0 - 149 mg/dL   HDL 46 >60 mg/dL   VLDL Cholesterol Cal 16 5 - 40 mg/dL   LDL Chol Calc (NIH) 84 0 - 99 mg/dL   Chol/HDL Ratio 3.2 0.0 - 5.0 ratio    Comment:                                   T. Chol/HDL Ratio                                             Men  Women  1/2 Avg.Risk  3.4    3.3                                   Avg.Risk  5.0    4.4                                2X Avg.Risk  9.6    7.1                                 3X Avg.Risk 23.4   11.0   Hepatic function panel     Status: Abnormal   Collection Time: 03/12/24  8:11 AM  Result Value Ref Range   Total Protein 7.1 6.0 - 8.5 g/dL   Albumin 4.4 3.8 - 4.8 g/dL   Bilirubin Total 0.6 0.0 - 1.2 mg/dL   Bilirubin, Direct 9.78 0.00 - 0.40 mg/dL   Alkaline Phosphatase 130 (H) 44 - 121 IU/L   AST 29 0 - 40 IU/L   ALT 25 0 - 44 IU/L      Assessment & Plan:     Melecio was seen today for annual exam.  Other specified attention deficit hyperactivity disorder (ADHD) -     Methylphenidate  HCl ER (OSM); Take 1 tablet (54 mg total) by mouth every morning.  Dispense: 30 tablet; Refill: 0 -     Methylphenidate  HCl ER (OSM); Take 1 tablet (54 mg total) by mouth every morning.  Dispense: 30 tablet; Refill: 0 -     Methylphenidate  HCl ER (OSM); Take 1 tablet (54 mg total) by mouth every morning.  Dispense: 30 tablet; Refill: 0  Alkaline phosphatase elevation -     Lipid panel  Mixed hyperlipidemia -     CK; Future -     Lipid panel -     Comprehensive metabolic panel with GFR   CAD-continue secondary prevention. Problem List Items Addressed This Visit       Other   Hyperlipidemia   Relevant Orders   CK   Lipid panel   Comprehensive metabolic panel with GFR   Other specified attention deficit hyperactivity disorder (ADHD) - Primary   Relevant Medications   methylphenidate  54 MG PO CR tablet (Start on 04/05/2024)   methylphenidate  (CONCERTA ) 54 MG PO CR tablet (Start on 05/06/2024)   methylphenidate  (CONCERTA ) 54 MG PO CR tablet (Start on 06/06/2024)   Alkaline phosphatase elevation   Relevant Orders   Lipid panel    Return in 3 months (on 06/17/2024) for fu with labs prior.   Total time spent: 35 minutes  Sherrill Cinderella Perry, MD  03/17/2024   This document may have been prepared by [facility] Voice Recognition software and as such may include unintentional dictation errors.

## 2024-06-12 ENCOUNTER — Other Ambulatory Visit

## 2024-06-12 DIAGNOSIS — E782 Mixed hyperlipidemia: Secondary | ICD-10-CM

## 2024-06-13 LAB — LIPID PANEL
Chol/HDL Ratio: 3.1 ratio (ref 0.0–5.0)
Cholesterol, Total: 151 mg/dL (ref 100–199)
HDL: 49 mg/dL (ref >39)
LDL Chol Calc (NIH): 83 mg/dL (ref 0–99)
Triglycerides: 103 mg/dL (ref 0–149)
VLDL Cholesterol Cal: 19 mg/dL (ref 5–40)

## 2024-06-13 LAB — COMPREHENSIVE METABOLIC PANEL WITH GFR
ALT: 25 IU/L (ref 0–44)
AST: 25 IU/L (ref 0–40)
Albumin: 4.3 g/dL (ref 3.8–4.8)
Alkaline Phosphatase: 116 IU/L (ref 47–123)
BUN/Creatinine Ratio: 20 (ref 10–24)
BUN: 15 mg/dL (ref 8–27)
Bilirubin Total: 0.4 mg/dL (ref 0.0–1.2)
CO2: 23 mmol/L (ref 20–29)
Calcium: 9.6 mg/dL (ref 8.6–10.2)
Chloride: 102 mmol/L (ref 96–106)
Creatinine, Ser: 0.76 mg/dL (ref 0.76–1.27)
Globulin, Total: 2.6 g/dL (ref 1.5–4.5)
Glucose: 101 mg/dL — ABNORMAL HIGH (ref 70–99)
Potassium: 4.7 mmol/L (ref 3.5–5.2)
Sodium: 140 mmol/L (ref 134–144)
Total Protein: 6.9 g/dL (ref 6.0–8.5)
eGFR: 95 mL/min/1.73 (ref >59)

## 2024-06-13 LAB — CK: Total CK: 160 U/L (ref 41–331)

## 2024-06-16 NOTE — Progress Notes (Unsigned)
 Cardiology Office Note  Date:  06/17/2024   ID:  Joshua Horn, DOB Mar 29, 1951, MRN [id number]  PCP:  Albina GORMAN Dine, MD   Chief Complaint  Patient presents with   12 month follow up     Denies chest pain or shortness of breath.     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   2.5 x 18 mm Promus stent placed in the OM  chest pain symptoms in 2009 with repeat catheterization showing no significant progression  Cardiac catheterization April 2014 chronically occluded proximal RCA, patent OM stent, moderate left circumflex disease He presents for routine followup  Of his coronary artery disease  Last seen by myself 01/2018 Seen by one of our providers September 2023  In follow-up reports doing well, recent fall, broke his right wrist playing pickle ball, now healed, ready to start activity again  Has been working on diet, exercise and effort to get cholesterol numbers down LDL 3 months ago and again 5 days ago remains mid 80s Total cholesterol 150s  Denies chest pain on exertion, no significant shortness of breath  EKG personally reviewed by myself on todays visit EKG Interpretation Date/Time:  Tuesday June 17 2024 08:42:54 EST Ventricular Rate:  67 PR Interval:  144 QRS Duration:  110 QT Interval:  424 QTC Calculation: 448 R Axis:   -63  Text Interpretation: Normal sinus rhythm Incomplete right bundle branch block Confirmed by Perla Lye [phone]) on 06/17/2024 8:59:27 AM   Other past medical history reviewed 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  weight dropped 50 pounds in  recovery  Total chol 151, LDL 83   Cardiac catheterization  11/29/2012. This showed chronically occluded proximal RCA, moderate distal left circumflex disease, OM1 stent was patent with no in-stent restenosis, LAD with mild to moderate mid disease, long region, normal ejection fraction estimated at greater than 55%    PMH:   has a past medical history of ADHD (attention deficit hyperactivity disorder), Coronary artery disease, Depression, DU (duodenal ulcer), Gastric ulcer (2017), HOH (hard of hearing), Hyperlipidemia, Hypertension, and Wears partial dentures.  PSH:    Past Surgical History:  Procedure Laterality Date   abdominal surgery  04/2016   to correct SBO   CARDIAC CATHETERIZATION  11/29/2012   stent-X1   CATARACT EXTRACTION W/PHACO Right 01/02/2018   Procedure: CATARACT EXTRACTION PHACO AND INTRAOCULAR LENS PLACEMENT (IOC)  RIGHT;  Surgeon: Mittie Gaskin, MD;  Location: [facility];  Service: Ophthalmology;  Laterality: Right;   CORONARY ANGIOPLASTY     ESOPHAGOGASTRODUODENOSCOPY N/A 02/20/2016   Procedure: ESOPHAGOGASTRODUODENOSCOPY (EGD);  Surgeon: Lamar Bunk, MD;  Location: [facility] END[facility]COPY;  Service: Endoscopy;  Laterality: N/A;   ESOPHAGOGASTRODUODENOSCOPY (EGD) WITH PROPOFOL  N/A 03/27/2016   Procedure: ESOPHAGOGASTRODUODENOSCOPY (EGD) WITH PROPOFOL ;  Surgeon: Lamar ONEIDA Holmes, MD;  Location: [facility] ENDOSCOPY;  Service: Endoscopy;  Laterality: N/A;   INTESTINAL BYPASS     bypass small bowel constricture 04/04/16    Current Outpatient Medications  Medication Sig Dispense Refill   ezetimibe  (ZETIA ) 10 MG tablet TAKE 1 TABLET BY MOUTH ONCE DAILY 30 tablet 0   FLUoxetine (PROZAC) 20 MG tablet Take 20 mg by mouth 2 (two)  times daily. Am and dinner     methylphenidate  (CONCERTA ) 54 MG PO CR tablet Take 1 tablet (54 mg total) by mouth every morning. 30 tablet 0   metoprolol  succinate (TOPROL -XL) 25 MG 24 hr tablet TAKE 1 TABLET BY MOUTH ONCE EVERY MORNING 30 tablet 8    rosuvastatin  (CRESTOR ) 40 MG tablet TAKE 1 TABLET BY MOUTH ONCE DAILY 90 tablet 0   No current facility-administered medications for this visit.    Allergies:   Sulfa antibiotics, Bupropion, and Other   Social History:  The patient  reports that he has never smoked. He has never used smokeless tobacco. He reports that he does not drink alcohol and does not use drugs.   Family History:   family history includes Heart attack (age of onset: 13) in his father; Stroke in his mother.   Review of Systems: Review of Systems  Constitutional: Negative.   Respiratory: Negative.    Cardiovascular: Negative.   Gastrointestinal: Negative.   Musculoskeletal: Negative.   Neurological: Negative.   Psychiatric/Behavioral: Negative.    All other systems reviewed and are negative.  PHYSICAL EXAM: VS:  BP 122/70 (BP Location: Left Arm, Patient Position: Sitting, Cuff Size: Normal)   Pulse 67   Ht 6' (1.829 m)   Wt 182 lb (82.6 kg)   SpO2 95%   BMI 24.68 kg/m  , BMI Body mass index is 24.68 kg/m. Constitutional:  oriented to person, place, and time. No distress.  HENT:  Head: Grossly normal Eyes:  no discharge. No scleral icterus.  Neck: No JVD, no carotid bruits  Cardiovascular: Regular rate and rhythm, no murmurs appreciated Pulmonary/Chest: Clear to auscultation bilaterally, no wheezes or rails Abdominal: Soft.  no distension.  no tenderness.  Musculoskeletal: Normal range of motion Neurological:  normal muscle tone. Coordination normal. No atrophy Skin: Skin warm and dry Psychiatric: normal affect, pleasant  Recent Labs: 06/12/2024: ALT 25; BUN 15; Creatinine, Ser 0.76; Potassium 4.7; Sodium 140   Lipid Panel Lab Results  Component Value Date   CHOL 151 06/12/2024   HDL 49 06/12/2024   LDLCALC 83 06/12/2024   TRIG 103 06/12/2024    Wt Readings from Last 3 Encounters:  06/17/24 182 lb (82.6 kg)  03/17/24 173 lb 6.4 oz (78.7 kg)  01/15/24 173 lb 9.6 oz (78.7 kg)      ASSESSMENT AND PLAN:  Coronary artery disease involving native coronary artery of native heart with unstable angina pectoris (HCC)  Currently with no symptoms of angina. No further workup at this time. Continue current medication regimen.  Essential hypertension  Blood pressure is well controlled on today's visit. No changes made to the medications.  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 06/17/2024  [facility] [place], [place] [phone]

## 2024-06-17 ENCOUNTER — Telehealth: Payer: Self-pay | Admitting: Pharmacy Technician

## 2024-06-17 ENCOUNTER — Encounter: Payer: Self-pay | Admitting: Cardiovascular Disease

## 2024-06-17 ENCOUNTER — Ambulatory Visit: Payer: Self-pay | Attending: Cardiovascular Disease | Admitting: Cardiovascular Disease

## 2024-06-17 VITALS — BP 122/70 | HR 67 | Ht 72.0 in | Wt 182.0 lb

## 2024-06-17 DIAGNOSIS — I25118 Atherosclerotic heart disease of native coronary artery with other forms of angina pectoris: Secondary | ICD-10-CM

## 2024-06-17 DIAGNOSIS — I10 Essential (primary) hypertension: Secondary | ICD-10-CM

## 2024-06-17 DIAGNOSIS — E782 Mixed hyperlipidemia: Secondary | ICD-10-CM

## 2024-06-17 MED ORDER — EZETIMIBE 10 MG PO TABS: 10.0000 mg | ORAL_TABLET | Freq: Every day | ORAL | 3 refills | Status: AC

## 2024-06-17 MED ORDER — REPATHA SURECLICK 140 MG/ML ~~LOC~~ SOAJ: 140.0000 mg | SUBCUTANEOUS | 3 refills | Status: AC

## 2024-06-17 MED ORDER — ROSUVASTATIN CALCIUM 40 MG PO TABS: 40.0000 mg | ORAL_TABLET | Freq: Every day | ORAL | 3 refills | Status: AC

## 2024-06-17 NOTE — Telephone Encounter (Signed)
   Pharmacy Patient Advocate Encounter   Received notification from CoverMyMeds that prior authorization for repatha is required/requested.   Insurance verification completed.   The patient is insured through [facility].   Per test claim: PA required; PA submitted to above mentioned insurance via Latent Key/confirmation #/EOC [id number] Status is pending

## 2024-06-17 NOTE — Patient Instructions (Addendum)
 Medication Instructions:   Please start the repatha 140 mg sq every 2 weeks  If you need a refill on your cardiac medications before your next appointment, please call your pharmacy.   Lab work: No new labs needed  Testing/Procedures: No new testing needed  Follow-Up: At [facility], you and your health needs are our priority.  As part of our continuing mission to provide you with exceptional heart care, we have created designated Provider Care Teams.  These Care Teams include your primary Cardiologist (physician) and Advanced Practice Providers (APPs -  Physician Assistants and Nurse Practitioners) who all work together to provide you with the care you need, when you need it.  You will need a follow up appointment in 12 months  Providers on your designated Care Team:   Lonni Meager, NP Bernardino Bring, PA-C Cadence [place], [place]  COVID-19 Vaccine Information can be found at: [email or web address] For questions related to vaccine distribution or appointments, please email [email or web address] or call [phone].

## 2024-06-18 ENCOUNTER — Ambulatory Visit: Payer: Self-pay | Admitting: Internal Medicine

## 2024-06-18 ENCOUNTER — Ambulatory Visit (INDEPENDENT_AMBULATORY_CARE_PROVIDER_SITE_OTHER): Admitting: Internal Medicine

## 2024-06-18 ENCOUNTER — Encounter: Payer: Self-pay | Admitting: Internal Medicine

## 2024-06-18 VITALS — BP 128/78 | HR 62 | Temp 98.2°F | Ht 72.0 in | Wt 182.0 lb

## 2024-06-18 DIAGNOSIS — I10 Essential (primary) hypertension: Secondary | ICD-10-CM

## 2024-06-18 DIAGNOSIS — N400 Enlarged prostate without lower urinary tract symptoms: Secondary | ICD-10-CM

## 2024-06-18 DIAGNOSIS — F908 Attention-deficit hyperactivity disorder, other type: Secondary | ICD-10-CM | POA: Diagnosis not present

## 2024-06-18 DIAGNOSIS — Z23 Encounter for immunization: Secondary | ICD-10-CM | POA: Diagnosis not present

## 2024-06-18 DIAGNOSIS — E782 Mixed hyperlipidemia: Secondary | ICD-10-CM

## 2024-06-18 MED ORDER — METHYLPHENIDATE HCL ER (OSM) 54 MG PO TBCR: 54.0000 mg | EXTENDED_RELEASE_TABLET | ORAL | 0 refills | Status: DC

## 2024-06-18 NOTE — Telephone Encounter (Signed)
 Pharmacy Patient Advocate Encounter  Received notification from [facility] that Prior Authorization for repatha has been APPROVED from 06/17/24 to 06/17/25   PA #/Case ID/Reference #: [id number]

## 2024-06-18 NOTE — Progress Notes (Signed)
 Established Patient Office Visit  Subjective:  Patient ID: Joshua Horn, male    DOB: 06/19/1951  Age: 73 y.o. MRN: [id number]  Chief Complaint  Patient presents with   Follow-up    3 month lab results    No new complaints, here for lab review and medication refills. Gained weight due to lack of exercise as he broke his wrist playing pickle ball. Labs reviewed and notable for LDL of 83 for which Cardiology started him on Repatha.    No other concerns at this time.   Past Medical History:  Diagnosis Date   ADHD (attention deficit hyperactivity disorder)    Coronary artery disease    Jessel Gettinger/p stent    Depression    DU (duodenal ulcer)    Gastric ulcer 2017   HOH (hard of hearing)    uses aids   Hyperlipidemia    Hypertension    controlled on meds   Wears partial dentures    upper and lower    Past Surgical History:  Procedure Laterality Date   abdominal surgery  04/2016   to correct SBO   CARDIAC CATHETERIZATION  11/29/2012   stent-X1   CATARACT EXTRACTION W/PHACO Right 01/02/2018   Procedure: CATARACT EXTRACTION PHACO AND INTRAOCULAR LENS PLACEMENT (IOC)  RIGHT;  Surgeon: Mittie Gaskin, MD;  Location: [facility];  Service: Ophthalmology;  Laterality: Right;   CORONARY ANGIOPLASTY     ESOPHAGOGASTRODUODENOSCOPY N/A 02/20/2016   Procedure: ESOPHAGOGASTRODUODENOSCOPY (EGD);  Surgeon: Lamar Bunk, MD;  Location: [facility] ENDOSCOPY;  Service: Endoscopy;  Laterality: N/A;   ESOPHAGOGASTRODUODENOSCOPY (EGD) WITH PROPOFOL  N/A 03/27/2016   Procedure: ESOPHAGOGASTRODUODENOSCOPY (EGD) WITH PROPOFOL ;  Surgeon: Lamar ONEIDA Holmes, MD;  Location: [facility] ENDOSCOPY;  Service: Endoscopy;  Laterality: N/A;   INTESTINAL BYPASS     bypass small bowel constricture 04/04/16    Social History   Socioeconomic History   Marital status: Married    Spouse name: Not on file   Number of children: Not on file   Years of education: Not on file   Highest education level: Professional  school degree (e.g., MD, DDS, DVM, JD)  Occupational History   Not on file  Tobacco Use   Smoking status: Never   Smokeless tobacco: Never  Vaping Use   Vaping status: Never Used  Substance and Sexual Activity   Alcohol use: No   Drug use: No   Sexual activity: Not on file  Other Topics Concern   Not on file  Social History Narrative   Not on file   Social Drivers of Health   Financial Resource Strain: Not on file  Food Insecurity: Not on file  Transportation Needs: Not on file  Physical Activity: Not on file  Stress: Not on file  Social Connections: Not on file  Intimate Partner Violence: Not on file    Family History  Problem Relation Age of Onset   Stroke Mother    Heart attack Father 40    Allergies  Allergen Reactions   Sulfa Antibiotics Other (See Comments)    rash   Bupropion Rash and Dermatitis   Other Rash    rash    Outpatient Medications Prior to Visit  Medication Sig   Evolocumab (REPATHA SURECLICK) 140 MG/ML SOAJ Inject 140 mg into the skin every 14 (fourteen) days.   ezetimibe  (ZETIA ) 10 MG tablet Take 1 tablet (10 mg total) by mouth daily.   FLUoxetine (PROZAC) 20 MG tablet Take 20 mg by mouth 2 (two) times daily. Am  and dinner   metoprolol  succinate (TOPROL -XL) 25 MG 24 hr tablet TAKE 1 TABLET BY MOUTH ONCE EVERY MORNING   rosuvastatin  (CRESTOR ) 40 MG tablet Take 1 tablet (40 mg total) by mouth daily.   [DISCONTINUED] methylphenidate  (CONCERTA ) 54 MG PO CR tablet Take 1 tablet (54 mg total) by mouth every morning.   No facility-administered medications prior to visit.    Review of Systems  Constitutional:  Negative for malaise/fatigue and weight loss (gained 9 lbs).  HENT: Negative.  Negative for congestion.   Eyes: Negative.   Respiratory: Negative.    Cardiovascular:  Negative for chest pain and claudication.  Gastrointestinal: Negative.   Genitourinary:        Nocturia  Musculoskeletal:  Positive for falls.  Neurological:  Negative  for dizziness.  Endo/Heme/Allergies: Negative.   Psychiatric/Behavioral:  Negative for depression (controlled on ssri). The patient has insomnia.        Objective:   BP 128/78   Pulse 62   Temp 98.2 F (36.8 C)   Ht 6' (1.829 m)   Wt 182 lb (82.6 kg)   SpO2 98%   BMI 24.68 kg/m   Vitals:   06/18/24 0946  BP: 128/78  Pulse: 62  Temp: 98.2 F (36.8 C)  Height: 6' (1.829 m)  Weight: 182 lb (82.6 kg)  SpO2: 98%  BMI (Calculated): 24.68    Physical Exam Vitals reviewed.  Constitutional:      Appearance: Normal appearance.  HENT:     Head: Normocephalic.     Left Ear: There is no impacted cerumen.     Nose: Nose normal.     Mouth/Throat:     Mouth: Mucous membranes are moist.     Pharynx: No posterior oropharyngeal erythema.  Eyes:     Extraocular Movements: Extraocular movements intact.     Pupils: Pupils are equal, round, and reactive to light.  Cardiovascular:     Rate and Rhythm: Regular rhythm.     Chest Wall: PMI is not displaced.     Pulses: Normal pulses.     Heart sounds: Normal heart sounds. No murmur heard. Pulmonary:     Effort: Pulmonary effort is normal.     Breath sounds: Normal air entry. No rhonchi or rales.  Abdominal:     General: Abdomen is flat. Bowel sounds are normal. There is no distension.     Palpations: Abdomen is soft. There is no hepatomegaly, splenomegaly or mass.     Tenderness: There is no abdominal tenderness.  Musculoskeletal:        General: Normal range of motion.     Cervical back: Normal range of motion and neck supple.     Right lower leg: No edema.     Left lower leg: No edema.  Skin:    General: Skin is warm and dry.  Neurological:     General: No focal deficit present.     Mental Status: He is alert and oriented to person, place, and time.     Cranial Nerves: No cranial nerve deficit.     Motor: No weakness.  Psychiatric:        Mood and Affect: Mood normal.        Behavior: Behavior normal.      No  results found for any visits on 06/18/24.  Recent Results (from the past 2160 hours)  Lipid panel     Status: None   Collection Time: 06/12/24  8:47 AM  Result Value Ref Range  Cholesterol, Total 151 100 - 199 mg/dL   Triglycerides 896 0 - 149 mg/dL   HDL 49 >60 mg/dL   VLDL Cholesterol Cal 19 5 - 40 mg/dL   LDL Chol Calc (NIH) 83 0 - 99 mg/dL   Chol/HDL Ratio 3.1 0.0 - 5.0 ratio    Comment:                                   T. Chol/HDL Ratio                                             Men  Women                               1/2 Avg.Risk  3.4    3.3                                   Avg.Risk  5.0    4.4                                2X Avg.Risk  9.6    7.1                                3X Avg.Risk 23.4   11.0   Comprehensive metabolic panel with GFR     Status: Abnormal   Collection Time: 06/12/24  8:47 AM  Result Value Ref Range   Glucose 101 (H) 70 - 99 mg/dL   BUN 15 8 - 27 mg/dL   Creatinine, Ser 9.23 0.76 - 1.27 mg/dL   eGFR 95 >40 fO/fpw/8.26   BUN/Creatinine Ratio 20 10 - 24   Sodium 140 134 - 144 mmol/L   Potassium 4.7 3.5 - 5.2 mmol/L   Chloride 102 96 - 106 mmol/L   CO2 23 20 - 29 mmol/L   Calcium  9.6 8.6 - 10.2 mg/dL   Total Protein 6.9 6.0 - 8.5 g/dL   Albumin 4.3 3.8 - 4.8 g/dL   Globulin, Total 2.6 1.5 - 4.5 g/dL   Bilirubin Total 0.4 0.0 - 1.2 mg/dL   Alkaline Phosphatase 116 47 - 123 IU/L   AST 25 0 - 40 IU/L   ALT 25 0 - 44 IU/L  CK     Status: None   Collection Time: 06/12/24  8:48 AM  Result Value Ref Range   Total CK 160 41 - 331 U/L      Assessment & Plan:  Colby was seen today for follow-up.  Mixed hyperlipidemia  Other specified attention deficit hyperactivity disorder (ADHD) -     Methylphenidate  HCl ER (OSM); Take 1 tablet (54 mg total) by mouth every morning.  Dispense: 30 tablet; Refill: 0 -     Methylphenidate  HCl ER (OSM); Take 1 tablet (54 mg total) by mouth every morning.  Dispense: 30 tablet; Refill: 0  Needs flu shot -      Flu Vaccine Trivalent High Dose (Fluad)  Essential hypertension -     CBC With Diff/Platelet -     Comprehensive metabolic  panel with GFR  Benign prostatic hyperplasia without lower urinary tract symptoms -     PSA    Problem List Items Addressed This Visit       Cardiovascular and Mediastinum   Essential hypertension   Relevant Orders   CBC With Diff/Platelet   Comprehensive metabolic panel with GFR     Other   Hyperlipidemia - Primary   Other specified attention deficit hyperactivity disorder (ADHD)   Relevant Medications   methylphenidate  (CONCERTA ) 54 MG PO CR tablet (Start on 07/07/2024)   methylphenidate  54 MG PO CR tablet (Start on 08/06/2024)   Other Visit Diagnoses       Needs flu shot       Relevant Orders   Flu Vaccine Trivalent High Dose (Fluad) (Completed)     Benign prostatic hyperplasia without lower urinary tract symptoms       Relevant Orders   PSA       Return in about 2 months (around 08/18/2024) for awv with labs prior.   Total time spent: 20 minutes. This time includes review of previous notes and results and patient face to face interaction during today'Kerryn Tennant visit.    Sherrill Cinderella Perry, MD  06/18/2024   This document may have been prepared by [facility] Voice Recognition software and as such may include unintentional dictation errors.

## 2024-07-23 ENCOUNTER — Other Ambulatory Visit: Payer: Self-pay | Admitting: Internal Medicine

## 2024-07-23 MED ORDER — METOPROLOL SUCCINATE ER 25 MG PO TB24: ORAL_TABLET | ORAL | 1 refills | Status: AC

## 2024-07-24 ENCOUNTER — Other Ambulatory Visit: Payer: Self-pay

## 2024-08-11 ENCOUNTER — Other Ambulatory Visit

## 2024-08-12 LAB — COMPREHENSIVE METABOLIC PANEL WITH GFR
ALT: 26 IU/L (ref 0–44)
AST: 29 IU/L (ref 0–40)
Albumin: 4.2 g/dL (ref 3.8–4.8)
Alkaline Phosphatase: 118 IU/L (ref 47–123)
BUN/Creatinine Ratio: 25 — ABNORMAL HIGH (ref 10–24)
BUN: 16 mg/dL (ref 8–27)
Bilirubin Total: 0.4 mg/dL (ref 0.0–1.2)
CO2: 21 mmol/L (ref 20–29)
Calcium: 9.2 mg/dL (ref 8.6–10.2)
Chloride: 104 mmol/L (ref 96–106)
Creatinine, Ser: 0.64 mg/dL — ABNORMAL LOW (ref 0.76–1.27)
Globulin, Total: 2.3 g/dL (ref 1.5–4.5)
Glucose: 100 mg/dL — ABNORMAL HIGH (ref 70–99)
Potassium: 4.5 mmol/L (ref 3.5–5.2)
Sodium: 141 mmol/L (ref 134–144)
Total Protein: 6.5 g/dL (ref 6.0–8.5)
eGFR: 101 mL/min/1.73

## 2024-08-12 LAB — CBC WITH DIFF/PLATELET
Basophils Absolute: 0 x10E3/uL (ref 0.0–0.2)
Basos: 0 %
EOS (ABSOLUTE): 0.1 x10E3/uL (ref 0.0–0.4)
Eos: 2 %
Hematocrit: 42.4 % (ref 37.5–51.0)
Hemoglobin: 14 g/dL (ref 13.0–17.7)
Immature Grans (Abs): 0 x10E3/uL (ref 0.0–0.1)
Immature Granulocytes: 0 %
Lymphocytes Absolute: 1.3 x10E3/uL (ref 0.7–3.1)
Lymphs: 26 %
MCH: 31.2 pg (ref 26.6–33.0)
MCHC: 33 g/dL (ref 31.5–35.7)
MCV: 94 fL (ref 79–97)
Monocytes Absolute: 0.6 x10E3/uL (ref 0.1–0.9)
Monocytes: 12 %
Neutrophils Absolute: 3 x10E3/uL (ref 1.4–7.0)
Neutrophils: 60 %
Platelets: 257 x10E3/uL (ref 150–450)
RBC: 4.49 x10E6/uL (ref 4.14–5.80)
RDW: 12.5 % (ref 11.6–15.4)
WBC: 5 x10E3/uL (ref 3.4–10.8)

## 2024-08-12 LAB — PSA: Prostate Specific Ag, Serum: 1.1 ng/mL (ref 0.0–4.0)

## 2024-08-18 ENCOUNTER — Ambulatory Visit: Payer: Self-pay | Admitting: Internal Medicine

## 2024-08-18 ENCOUNTER — Ambulatory Visit (INDEPENDENT_AMBULATORY_CARE_PROVIDER_SITE_OTHER): Admitting: Internal Medicine

## 2024-08-18 VITALS — BP 122/74 | HR 67 | Temp 97.9°F | Ht 72.0 in | Wt 181.4 lb

## 2024-08-18 DIAGNOSIS — E782 Mixed hyperlipidemia: Secondary | ICD-10-CM

## 2024-08-18 DIAGNOSIS — Z0130 Encounter for examination of blood pressure without abnormal findings: Secondary | ICD-10-CM

## 2024-08-18 DIAGNOSIS — I2510 Atherosclerotic heart disease of native coronary artery without angina pectoris: Secondary | ICD-10-CM

## 2024-08-18 DIAGNOSIS — F908 Attention-deficit hyperactivity disorder, other type: Secondary | ICD-10-CM | POA: Diagnosis not present

## 2024-08-18 DIAGNOSIS — R7303 Prediabetes: Secondary | ICD-10-CM | POA: Diagnosis not present

## 2024-08-18 MED ORDER — METHYLPHENIDATE HCL ER (OSM) 54 MG PO TBCR: 54.0000 mg | EXTENDED_RELEASE_TABLET | ORAL | 0 refills | Status: AC

## 2024-08-18 NOTE — Progress Notes (Signed)
 "  Established Patient Office Visit  Subjective:  Patient ID: Joshua Horn, male    DOB: 12/03/1950  Age: 74 y.o. MRN: [id number]  Chief Complaint  Patient presents with   Follow-up    2 month follow up    No new complaints, here for lab review and medication refills.     No other concerns at this time.   Past Medical History:  Diagnosis Date   ADHD (attention deficit hyperactivity disorder)    Coronary artery disease    Ludell Zacarias/p stent    Depression    DU (duodenal ulcer)    Gastric ulcer 2017   HOH (hard of hearing)    uses aids   Hyperlipidemia    Hypertension    controlled on meds   Wears partial dentures    upper and lower    Past Surgical History:  Procedure Laterality Date   abdominal surgery  04/2016   to correct SBO   CARDIAC CATHETERIZATION  11/29/2012   stent-X1   CATARACT EXTRACTION W/PHACO Right 01/02/2018   Procedure: CATARACT EXTRACTION PHACO AND INTRAOCULAR LENS PLACEMENT (IOC)  RIGHT;  Surgeon: Mittie Gaskin, MD;  Location: [facility];  Service: Ophthalmology;  Laterality: Right;   CORONARY ANGIOPLASTY     ESOPHAGOGASTRODUODENOSCOPY N/A 02/20/2016   Procedure: ESOPHAGOGASTRODUODENOSCOPY (EGD);  Surgeon: Lamar Bunk, MD;  Location: [facility] ENDOSCOPY;  Service: Endoscopy;  Laterality: N/A;   ESOPHAGOGASTRODUODENOSCOPY (EGD) WITH PROPOFOL  N/A 03/27/2016   Procedure: ESOPHAGOGASTRODUODENOSCOPY (EGD) WITH PROPOFOL ;  Surgeon: Lamar ONEIDA Holmes, MD;  Location: [facility] ENDOSCOPY;  Service: Endoscopy;  Laterality: N/A;   INTESTINAL BYPASS     bypass small bowel constricture 04/04/16    Social History   Socioeconomic History   Marital status: Married    Spouse name: Not on file   Number of children: Not on file   Years of education: Not on file   Highest education level: Professional school degree (e.g., MD, DDS, DVM, JD)  Occupational History   Not on file  Tobacco Use   Smoking status: Never   Smokeless tobacco: Never  Vaping Use   Vaping  status: Never Used  Substance and Sexual Activity   Alcohol use: No   Drug use: No   Sexual activity: Not on file  Other Topics Concern   Not on file  Social History Narrative   Not on file   Social Drivers of Health   Tobacco Use: Low Risk (06/18/2024)   Patient History    Smoking Tobacco Use: Never    Smokeless Tobacco Use: Never    Passive Exposure: Not on file  Financial Resource Strain: Not on file  Food Insecurity: Not on file  Transportation Needs: Not on file  Physical Activity: Not on file  Stress: Not on file  Social Connections: Not on file  Intimate Partner Violence: Not on file  Depression (PHQ2-9): Low Risk (01/15/2024)   Depression (PHQ2-9)    PHQ-2 Score: 0  Alcohol Screen: Not on file  Housing: Not on file  Utilities: Not on file  Health Literacy: Not on file    Family History  Problem Relation Age of Onset   Stroke Mother    Heart attack Father 73    Allergies[1]  Show/hide medication list[2]  Review of Systems  Constitutional:  Positive for weight loss (1 lb). Negative for malaise/fatigue.  HENT: Negative.  Negative for congestion.   Eyes: Negative.   Respiratory: Negative.    Cardiovascular:  Negative for chest pain and claudication.  Gastrointestinal:  Negative.   Genitourinary:        Nocturia  Musculoskeletal:  Positive for falls.  Neurological:  Negative for dizziness.  Endo/Heme/Allergies: Negative.   Psychiatric/Behavioral:  Negative for depression (controlled on ssri). The patient has insomnia.        Objective:   BP 122/74   Pulse 67   Temp 97.9 F (36.6 C)   Ht 6' (1.829 m)   Wt 181 lb 6.4 oz (82.3 kg)   SpO2 97%   BMI 24.60 kg/m   Vitals:   08/18/24 1010  BP: 122/74  Pulse: 67  Temp: 97.9 F (36.6 C)  Height: 6' (1.829 m)  Weight: 181 lb 6.4 oz (82.3 kg)  SpO2: 97%  BMI (Calculated): 24.6    Physical Exam Vitals reviewed.  Constitutional:      Appearance: Normal appearance.  HENT:     Head:  Normocephalic.     Left Ear: There is no impacted cerumen.     Nose: Nose normal.     Mouth/Throat:     Mouth: Mucous membranes are moist.     Pharynx: No posterior oropharyngeal erythema.  Eyes:     Extraocular Movements: Extraocular movements intact.     Pupils: Pupils are equal, round, and reactive to light.  Cardiovascular:     Rate and Rhythm: Regular rhythm.     Chest Wall: PMI is not displaced.     Pulses: Normal pulses.     Heart sounds: Normal heart sounds. No murmur heard. Pulmonary:     Effort: Pulmonary effort is normal.     Breath sounds: Normal air entry. No rhonchi or rales.  Abdominal:     General: Abdomen is flat. Bowel sounds are normal. There is no distension.     Palpations: Abdomen is soft. There is no hepatomegaly, splenomegaly or mass.     Tenderness: There is no abdominal tenderness.  Musculoskeletal:        General: Normal range of motion.     Cervical back: Normal range of motion and neck supple.     Right lower leg: No edema.     Left lower leg: No edema.  Skin:    General: Skin is warm and dry.  Neurological:     General: No focal deficit present.     Mental Status: He is alert and oriented to person, place, and time.     Cranial Nerves: No cranial nerve deficit.     Motor: No weakness.  Psychiatric:        Mood and Affect: Mood normal.        Behavior: Behavior normal.      No results found for any visits on 08/18/24.  Recent Results (from the past 2160 hours)  Lipid panel     Status: None   Collection Time: 06/12/24  8:47 AM  Result Value Ref Range   Cholesterol, Total 151 100 - 199 mg/dL   Triglycerides 896 0 - 149 mg/dL   HDL 49 >60 mg/dL   VLDL Cholesterol Cal 19 5 - 40 mg/dL   LDL Chol Calc (NIH) 83 0 - 99 mg/dL   Chol/HDL Ratio 3.1 0.0 - 5.0 ratio    Comment:                                   T. Chol/HDL Ratio  Men  Women                               1/2 Avg.Risk  3.4    3.3                                    Avg.Risk  5.0    4.4                                2X Avg.Risk  9.6    7.1                                3X Avg.Risk 23.4   11.0   Comprehensive metabolic panel with GFR     Status: Abnormal   Collection Time: 06/12/24  8:47 AM  Result Value Ref Range   Glucose 101 (H) 70 - 99 mg/dL   BUN 15 8 - 27 mg/dL   Creatinine, Ser 9.23 0.76 - 1.27 mg/dL   eGFR 95 >40 fO/fpw/8.26   BUN/Creatinine Ratio 20 10 - 24   Sodium 140 134 - 144 mmol/L   Potassium 4.7 3.5 - 5.2 mmol/L   Chloride 102 96 - 106 mmol/L   CO2 23 20 - 29 mmol/L   Calcium  9.6 8.6 - 10.2 mg/dL   Total Protein 6.9 6.0 - 8.5 g/dL   Albumin 4.3 3.8 - 4.8 g/dL   Globulin, Total 2.6 1.5 - 4.5 g/dL   Bilirubin Total 0.4 0.0 - 1.2 mg/dL   Alkaline Phosphatase 116 47 - 123 IU/L   AST 25 0 - 40 IU/L   ALT 25 0 - 44 IU/L  CK     Status: None   Collection Time: 06/12/24  8:48 AM  Result Value Ref Range   Total CK 160 41 - 331 U/L  CBC With Diff/Platelet     Status: None   Collection Time: 08/11/24  8:27 AM  Result Value Ref Range   WBC 5.0 3.4 - 10.8 x10E3/uL   RBC 4.49 4.14 - 5.80 x10E6/uL   Hemoglobin 14.0 13.0 - 17.7 g/dL   Hematocrit 57.5 62.4 - 51.0 %   MCV 94 79 - 97 fL   MCH 31.2 26.6 - 33.0 pg   MCHC 33.0 31.5 - 35.7 g/dL   RDW 87.4 88.3 - 84.5 %   Platelets 257 150 - 450 x10E3/uL   Neutrophils 60 Not Estab. %   Lymphs 26 Not Estab. %   Monocytes 12 Not Estab. %   Eos 2 Not Estab. %   Basos 0 Not Estab. %   Neutrophils Absolute 3.0 1.4 - 7.0 x10E3/uL   Lymphocytes Absolute 1.3 0.7 - 3.1 x10E3/uL   Monocytes Absolute 0.6 0.1 - 0.9 x10E3/uL   EOS (ABSOLUTE) 0.1 0.0 - 0.4 x10E3/uL   Basophils Absolute 0.0 0.0 - 0.2 x10E3/uL   Immature Granulocytes 0 Not Estab. %   Immature Grans (Abs) 0.0 0.0 - 0.1 x10E3/uL  Comprehensive metabolic panel with GFR     Status: Abnormal   Collection Time: 08/11/24  8:27 AM  Result Value Ref Range   Glucose 100 (H) 70 - 99 mg/dL   BUN 16 8 - 27 mg/dL    Creatinine, Ser 9.35 (L) 0.76 - 1.27  mg/dL   eGFR 898 >40 fO/fpw/8.26   BUN/Creatinine Ratio 25 (H) 10 - 24   Sodium 141 134 - 144 mmol/L   Potassium 4.5 3.5 - 5.2 mmol/L   Chloride 104 96 - 106 mmol/L   CO2 21 20 - 29 mmol/L   Calcium  9.2 8.6 - 10.2 mg/dL   Total Protein 6.5 6.0 - 8.5 g/dL   Albumin 4.2 3.8 - 4.8 g/dL   Globulin, Total 2.3 1.5 - 4.5 g/dL   Bilirubin Total 0.4 0.0 - 1.2 mg/dL   Alkaline Phosphatase 118 47 - 123 IU/L   AST 29 0 - 40 IU/L   ALT 26 0 - 44 IU/L  PSA     Status: None   Collection Time: 08/11/24  8:27 AM  Result Value Ref Range   Prostate Specific Ag, Serum 1.1 0.0 - 4.0 ng/mL    Comment: Roche ECLIA methodology. According to the American Urological Association, Serum PSA should decrease and remain at undetectable levels after radical prostatectomy. The AUA defines biochemical recurrence as an initial PSA value 0.2 ng/mL or greater followed by a subsequent confirmatory PSA value 0.2 ng/mL or greater. Values obtained with different assay methods or kits cannot be used interchangeably. Results cannot be interpreted as absolute evidence of the presence or absence of malignant disease.       Assessment & Plan:  Joshua Horn was seen today for follow-up.  Mixed hyperlipidemia -     Lipid panel  Other specified attention deficit hyperactivity disorder (ADHD) -     Methylphenidate  HCl ER (OSM); Take 1 tablet (54 mg total) by mouth every morning.  Dispense: 30 tablet; Refill: 0 -     Methylphenidate  HCl ER (OSM); Take 1 tablet (54 mg total) by mouth every morning.  Dispense: 30 tablet; Refill: 0  Prediabetes -     Hemoglobin A1c  Coronary artery disease involving native coronary artery of native heart without angina pectoris    Problem List Items Addressed This Visit       Cardiovascular and Mediastinum   CAD (coronary artery disease)     Other   Hyperlipidemia - Primary   Relevant Orders   Lipid panel   Other specified attention deficit  hyperactivity disorder (ADHD)   Relevant Medications   methylphenidate  (CONCERTA ) 54 MG PO CR tablet (Start on 09/05/2024)   methylphenidate  54 MG PO CR tablet (Start on 10/06/2024)   Other Visit Diagnoses       Prediabetes       Relevant Orders   Hemoglobin A1c       Return in about 2 months (around 10/16/2024) for fu with labs prior.   Total time spent: 20 minutes. This time includes review of previous notes and results and patient face to face interaction during today'Breeana Sawtelle visit.    Sherrill Cinderella Perry, MD  08/18/2024   This document may have been prepared by [facility] Voice Recognition software and as such may include unintentional dictation errors.     [1]  Allergies Allergen Reactions   Sulfa Antibiotics Other (See Comments)    rash   Bupropion Rash and Dermatitis   Other Rash    rash  [2]  Outpatient Medications Prior to Visit  Medication Sig   Evolocumab  (REPATHA  SURECLICK) 140 MG/ML SOAJ Inject 140 mg into the skin every 14 (fourteen) days.   ezetimibe  (ZETIA ) 10 MG tablet Take 1 tablet (10 mg total) by mouth daily.   FLUoxetine (PROZAC) 20 MG capsule TAKE 1 CAPSULE BY MOUTH TWICE  DAILY   FLUoxetine (PROZAC) 20 MG tablet Take 20 mg by mouth 2 (two) times daily. Am and dinner   metoprolol  succinate (TOPROL -XL) 25 MG 24 hr tablet TAKE 1 TABLET BY MOUTH ONCE EVERY MORNING   rosuvastatin  (CRESTOR ) 40 MG tablet Take 1 tablet (40 mg total) by mouth daily.   [DISCONTINUED] methylphenidate  (CONCERTA ) 54 MG PO CR tablet Take 1 tablet (54 mg total) by mouth every morning.   [DISCONTINUED] methylphenidate  54 MG PO CR tablet Take 1 tablet (54 mg total) by mouth every morning.   No facility-administered medications prior to visit.   "

## 2024-10-17 ENCOUNTER — Ambulatory Visit: Admitting: Internal Medicine
